# Patient Record
Sex: Female | Born: 1965
Health system: Southern US, Community
[De-identification: ages and names within clinical notes are randomized; demographics above are authoritative.]

## PROBLEM LIST (undated history)

## (undated) DIAGNOSIS — M941 Relapsing polychondritis: Secondary | ICD-10-CM

## (undated) DIAGNOSIS — N6320 Unspecified lump in the left breast, unspecified quadrant: Secondary | ICD-10-CM

## (undated) DIAGNOSIS — K589 Irritable bowel syndrome without diarrhea: Secondary | ICD-10-CM

## (undated) DIAGNOSIS — K219 Gastro-esophageal reflux disease without esophagitis: Secondary | ICD-10-CM

## (undated) DIAGNOSIS — G43909 Migraine, unspecified, not intractable, without status migrainosus: Secondary | ICD-10-CM

## (undated) DIAGNOSIS — G4730 Sleep apnea, unspecified: Secondary | ICD-10-CM

## (undated) DIAGNOSIS — H15009 Unspecified scleritis, unspecified eye: Secondary | ICD-10-CM

## (undated) HISTORY — PX: ESOPHAGEAL DILATION: SHX303

## (undated) HISTORY — PX: BREAST SURGERY: SHX581

## (undated) HISTORY — DX: Relapsing polychondritis: M94.1

## (undated) HISTORY — PX: BREAST EXCISIONAL BIOPSY: SUR124

## (undated) HISTORY — PX: TOOTH EXTRACTION: SUR596

---

## 1998-08-30 ENCOUNTER — Inpatient Hospital Stay (HOSPITAL_COMMUNITY): Admission: AD | Admit: 1998-08-30 | Discharge: 1998-08-30 | Payer: Self-pay | Admitting: Obstetrics and Gynecology

## 1998-10-23 ENCOUNTER — Inpatient Hospital Stay (HOSPITAL_COMMUNITY): Admission: AD | Admit: 1998-10-23 | Discharge: 1998-10-23 | Payer: Self-pay | Admitting: Obstetrics and Gynecology

## 1998-11-10 ENCOUNTER — Ambulatory Visit (HOSPITAL_COMMUNITY): Admission: RE | Admit: 1998-11-10 | Discharge: 1998-11-10 | Payer: Self-pay | Admitting: Obstetrics and Gynecology

## 1998-11-10 ENCOUNTER — Encounter: Payer: Self-pay | Admitting: Obstetrics and Gynecology

## 1998-11-25 ENCOUNTER — Inpatient Hospital Stay (HOSPITAL_COMMUNITY): Admission: AD | Admit: 1998-11-25 | Discharge: 1998-11-27 | Payer: Self-pay | Admitting: *Deleted

## 1999-08-31 ENCOUNTER — Other Ambulatory Visit: Admission: RE | Admit: 1999-08-31 | Discharge: 1999-08-31 | Payer: Self-pay | Admitting: *Deleted

## 2000-04-01 ENCOUNTER — Other Ambulatory Visit: Admission: RE | Admit: 2000-04-01 | Discharge: 2000-04-01 | Payer: Self-pay | Admitting: *Deleted

## 2000-04-17 ENCOUNTER — Encounter: Payer: Self-pay | Admitting: Emergency Medicine

## 2000-04-17 ENCOUNTER — Emergency Department (HOSPITAL_COMMUNITY): Admission: EM | Admit: 2000-04-17 | Discharge: 2000-04-17 | Payer: Self-pay | Admitting: Emergency Medicine

## 2000-05-08 ENCOUNTER — Other Ambulatory Visit: Admission: RE | Admit: 2000-05-08 | Discharge: 2000-05-08 | Payer: Self-pay | Admitting: *Deleted

## 2000-05-22 ENCOUNTER — Other Ambulatory Visit: Admission: RE | Admit: 2000-05-22 | Discharge: 2000-05-22 | Payer: Self-pay | Admitting: *Deleted

## 2000-05-22 ENCOUNTER — Encounter (INDEPENDENT_AMBULATORY_CARE_PROVIDER_SITE_OTHER): Payer: Self-pay

## 2000-06-06 ENCOUNTER — Encounter: Payer: Self-pay | Admitting: Internal Medicine

## 2000-06-06 ENCOUNTER — Encounter: Admission: RE | Admit: 2000-06-06 | Discharge: 2000-06-06 | Payer: Self-pay | Admitting: Internal Medicine

## 2000-11-28 ENCOUNTER — Other Ambulatory Visit: Admission: RE | Admit: 2000-11-28 | Discharge: 2000-11-28 | Payer: Self-pay | Admitting: *Deleted

## 2001-06-03 ENCOUNTER — Other Ambulatory Visit: Admission: RE | Admit: 2001-06-03 | Discharge: 2001-06-03 | Payer: Self-pay | Admitting: *Deleted

## 2001-12-25 ENCOUNTER — Other Ambulatory Visit: Admission: RE | Admit: 2001-12-25 | Discharge: 2001-12-25 | Payer: Self-pay | Admitting: *Deleted

## 2003-01-05 ENCOUNTER — Other Ambulatory Visit: Admission: RE | Admit: 2003-01-05 | Discharge: 2003-01-05 | Payer: Self-pay | Admitting: *Deleted

## 2004-02-08 ENCOUNTER — Other Ambulatory Visit: Admission: RE | Admit: 2004-02-08 | Discharge: 2004-02-08 | Payer: Self-pay | Admitting: *Deleted

## 2004-07-31 ENCOUNTER — Encounter: Admission: RE | Admit: 2004-07-31 | Discharge: 2004-07-31 | Payer: Self-pay | Admitting: Internal Medicine

## 2005-03-03 ENCOUNTER — Ambulatory Visit (HOSPITAL_COMMUNITY): Admission: RE | Admit: 2005-03-03 | Discharge: 2005-03-03 | Payer: Self-pay | Admitting: Diagnostic Radiology

## 2005-08-22 ENCOUNTER — Encounter: Admission: RE | Admit: 2005-08-22 | Discharge: 2005-08-22 | Payer: Self-pay | Admitting: Obstetrics and Gynecology

## 2005-11-29 ENCOUNTER — Encounter: Admission: RE | Admit: 2005-11-29 | Discharge: 2005-11-29 | Payer: Self-pay | Admitting: Internal Medicine

## 2006-09-03 ENCOUNTER — Encounter: Admission: RE | Admit: 2006-09-03 | Discharge: 2006-09-03 | Payer: Self-pay | Admitting: Obstetrics and Gynecology

## 2006-11-28 ENCOUNTER — Ambulatory Visit (HOSPITAL_COMMUNITY): Admission: RE | Admit: 2006-11-28 | Discharge: 2006-11-28 | Payer: Self-pay | Admitting: Gastroenterology

## 2006-12-09 ENCOUNTER — Ambulatory Visit (HOSPITAL_COMMUNITY): Admission: RE | Admit: 2006-12-09 | Discharge: 2006-12-09 | Payer: Self-pay | Admitting: Gastroenterology

## 2006-12-09 ENCOUNTER — Encounter (INDEPENDENT_AMBULATORY_CARE_PROVIDER_SITE_OTHER): Payer: Self-pay | Admitting: Specialist

## 2007-10-03 ENCOUNTER — Encounter: Admission: RE | Admit: 2007-10-03 | Discharge: 2007-10-03 | Payer: Self-pay | Admitting: Obstetrics and Gynecology

## 2008-04-11 ENCOUNTER — Emergency Department (HOSPITAL_COMMUNITY): Admission: EM | Admit: 2008-04-11 | Discharge: 2008-04-11 | Payer: Self-pay | Admitting: Emergency Medicine

## 2008-04-15 ENCOUNTER — Encounter: Admission: RE | Admit: 2008-04-15 | Discharge: 2008-04-15 | Payer: Self-pay | Admitting: Neurology

## 2008-04-15 ENCOUNTER — Ambulatory Visit: Payer: Self-pay

## 2008-04-15 ENCOUNTER — Encounter (INDEPENDENT_AMBULATORY_CARE_PROVIDER_SITE_OTHER): Payer: Self-pay | Admitting: Neurology

## 2008-11-12 ENCOUNTER — Encounter: Admission: RE | Admit: 2008-11-12 | Discharge: 2008-11-12 | Payer: Self-pay | Admitting: Obstetrics and Gynecology

## 2008-11-27 ENCOUNTER — Emergency Department (HOSPITAL_BASED_OUTPATIENT_CLINIC_OR_DEPARTMENT_OTHER): Admission: EM | Admit: 2008-11-27 | Discharge: 2008-11-27 | Payer: Self-pay | Admitting: Emergency Medicine

## 2009-04-12 ENCOUNTER — Encounter: Admission: RE | Admit: 2009-04-12 | Discharge: 2009-04-12 | Payer: Self-pay | Admitting: Obstetrics and Gynecology

## 2009-11-18 ENCOUNTER — Ambulatory Visit (HOSPITAL_COMMUNITY): Admission: RE | Admit: 2009-11-18 | Discharge: 2009-11-18 | Payer: Self-pay | Admitting: General Surgery

## 2009-11-30 ENCOUNTER — Encounter: Admission: RE | Admit: 2009-11-30 | Discharge: 2009-11-30 | Payer: Self-pay | Admitting: Obstetrics and Gynecology

## 2010-11-28 ENCOUNTER — Other Ambulatory Visit: Payer: Self-pay | Admitting: Obstetrics and Gynecology

## 2010-11-28 ENCOUNTER — Encounter
Admission: RE | Admit: 2010-11-28 | Discharge: 2010-11-28 | Payer: Self-pay | Source: Home / Self Care | Attending: Obstetrics and Gynecology | Admitting: Obstetrics and Gynecology

## 2010-11-28 DIAGNOSIS — R921 Mammographic calcification found on diagnostic imaging of breast: Secondary | ICD-10-CM

## 2010-11-29 ENCOUNTER — Other Ambulatory Visit: Payer: Self-pay | Admitting: Radiology

## 2010-11-29 ENCOUNTER — Other Ambulatory Visit: Payer: Self-pay | Admitting: Obstetrics and Gynecology

## 2010-11-29 ENCOUNTER — Ambulatory Visit
Admission: RE | Admit: 2010-11-29 | Discharge: 2010-11-29 | Disposition: A | Discharge status: Home / Self Care | Payer: PRIVATE HEALTH INSURANCE | Source: Ambulatory Visit | Attending: Obstetrics and Gynecology | Admitting: Obstetrics and Gynecology

## 2010-11-29 DIAGNOSIS — R921 Mammographic calcification found on diagnostic imaging of breast: Secondary | ICD-10-CM

## 2011-01-15 LAB — DIFFERENTIAL
Basophils Absolute: 0.1 10*3/uL (ref 0.0–0.1)
Basophils Relative: 1 % (ref 0–1)
Eosinophils Absolute: 0.3 10*3/uL (ref 0.0–0.7)
Eosinophils Relative: 6 % — ABNORMAL HIGH (ref 0–5)
Lymphocytes Relative: 29 % (ref 12–46)
Lymphs Abs: 1.7 10*3/uL (ref 0.7–4.0)
Monocytes Absolute: 0.4 10*3/uL (ref 0.1–1.0)

## 2011-01-15 LAB — URINALYSIS, ROUTINE W REFLEX MICROSCOPIC
Bilirubin Urine: NEGATIVE
Hgb urine dipstick: NEGATIVE
Specific Gravity, Urine: 1.026 (ref 1.005–1.030)
pH: 6 (ref 5.0–8.0)

## 2011-01-15 LAB — URINE MICROSCOPIC-ADD ON

## 2011-01-15 LAB — CBC
RBC: 3.84 MIL/uL — ABNORMAL LOW (ref 3.87–5.11)
WBC: 5.8 10*3/uL (ref 4.0–10.5)

## 2011-02-12 LAB — COMPREHENSIVE METABOLIC PANEL
CO2: 19 mEq/L (ref 19–32)
GFR calc Af Amer: 46 mL/min — ABNORMAL LOW (ref >60)
GFR calc non Af Amer: 38 mL/min — ABNORMAL LOW (ref >60)
Glucose, Bld: 178 mg/dL — ABNORMAL HIGH (ref 70–99)
Potassium: 4.2 mEq/L (ref 3.5–5.1)
Sodium: 142 mEq/L (ref 135–145)
Total Bilirubin: 1.1 mg/dL (ref 0.3–1.2)

## 2011-02-12 LAB — URINALYSIS, ROUTINE W REFLEX MICROSCOPIC
Leukocytes, UA: NEGATIVE
Specific Gravity, Urine: 1.023 (ref 1.005–1.030)
Urobilinogen, UA: 0.2 mg/dL (ref 0.0–1.0)

## 2011-02-12 LAB — URINE MICROSCOPIC-ADD ON

## 2011-02-12 LAB — DIFFERENTIAL
Basophils Relative: 1 % (ref 0–1)
Lymphs Abs: 0.4 10*3/uL — ABNORMAL LOW (ref 0.7–4.0)
Monocytes Relative: 5 % (ref 3–12)
Neutro Abs: 13.2 10*3/uL — ABNORMAL HIGH (ref 1.7–7.7)
Neutrophils Relative %: 91 % — ABNORMAL HIGH (ref 43–77)

## 2011-02-12 LAB — CBC
RDW: 12.1 % (ref 11.5–15.5)
WBC: 14.4 10*3/uL — ABNORMAL HIGH (ref 4.0–10.5)

## 2011-02-12 LAB — URINE CULTURE: Colony Count: >=100000

## 2011-03-16 NOTE — Op Note (Signed)
NAME:  Emily, Fisher             ACCOUNT NO.:  [id number]   MEDICAL RECORD NO.:  [id number]          PATIENT TYPE:  AMB   LOCATION:  ENDO                         FACILITY:  [facility]   PHYSICIAN:  Anselmo Rod, M.D.  DATE OF BIRTH:  [birth year]   DATE OF PROCEDURE:  12/10/2006  DATE OF DISCHARGE:                               OPERATIVE REPORT   PROCEDURE:  Esophagogastroduodenoscopy with multiple cold biopsies.   ENDOSCOPIST:  Charna Kateena, M.D.   INSTRUMENT USED:  Pentax video panendoscope.   INDICATIONS FOR PROCEDURE:  45 year old white female with a history of  dysphagia undergoing EGD and possible dilatation if needed.   PREPROCEDURE PREPARATION:  Informed consent was obtained from the  patient.  The patient was fasted for four hours prior to the procedure.  The risks and benefits of the procedure were discussed with the patient  in great detail.   PREPROCEDURE PHYSICAL:  The patient had stable vital signs.  Neck  supple.  Chest clear to auscultation.  S1 and S2 regular.  Abdomen soft  with normal bowel sounds.   DESCRIPTION OF PROCEDURE:  The patient was placed in the left lateral  decubitus position, sedated with 75 mcg of Fentanyl and 7.5 mg Versed  given intravenously in incremental doses.  Once the patient was  adequately sedated, maintained on low flow oxygen and continuous cardiac  monitoring, the Pentax video panendoscope was advanced through the mouth  piece over the tongue into the esophagus under direct vision.  The  entire esophagus was widely patent except for a slight narrowing at the  USE.  There was some mild difficulty in maneuvering the scope through  the USE and a small amount of  heme was seen on withdrawal of the scope  after the EGD.  A wide open Schatzki's ring was biopsied with jumbo bite  size forceps.  5-6 generous bites of the Schatzki's ring was done.  Some  nodularity was noted at the Z-line and that was biopsied, as well.  A  small hiatal  hernia was seen on high retroflexion.  The rest of the  gastric mucosa appeared healthy and so did the proximal small bowel.  Retroflexion in the high cardia revealed no abnormalities except for the  small hiatal hernia mentioned above.  The patient tolerated the  procedure well without complications.   IMPRESSION:  1. Slight narrowing at the upper esophageal sphincter spontaneously      dilated with passage of scope.  2. Wide open Schatzki's ring with nodular changes at the Z-line,      biopsies done.  3. Small hiatal hernia.  4. Normal proximal small bowel.  5. Normal gastric mucosa.   RECOMMENDATIONS:  1. Increase Prevacid to 30 mg b.i.d.  2. Soft diet for the next day.  3. Avoid all nonsteroidals.  4. Liberal amount of fluid intake with meals.  5. Cut up meats into very small pieces before swallowing.  6. Outpatient follow up as need arises in the future.      Anselmo Rod, M.D.  Electronically Signed     JNM/MEDQ  D:  12/10/2006  T:  12/10/2006  Job:  [id number]

## 2011-07-26 LAB — POCT I-STAT, CHEM 8
BUN: 11
Calcium, Ion: 1.18
Chloride: 105
Creatinine, Ser: 0.7
Glucose, Bld: 96
HCT: 37
Hemoglobin: 12.6
Potassium: 3.7
Sodium: 139
TCO2: 25

## 2011-07-26 LAB — B. BURGDORFI ANTIBODIES: B burgdorferi Ab IgG+IgM: 0.18

## 2011-07-26 LAB — ANGIOTENSIN CONVERTING ENZYME: Angiotensin-Converting Enzyme: 33 U/L (ref 9–67)

## 2011-08-21 ENCOUNTER — Other Ambulatory Visit: Payer: Self-pay | Admitting: Gastroenterology

## 2011-08-21 DIAGNOSIS — R102 Pelvic and perineal pain: Secondary | ICD-10-CM

## 2011-08-27 ENCOUNTER — Ambulatory Visit
Admission: RE | Admit: 2011-08-27 | Discharge: 2011-08-27 | Disposition: A | Discharge status: Home / Self Care | Payer: PRIVATE HEALTH INSURANCE | Source: Ambulatory Visit | Attending: Gastroenterology | Admitting: Gastroenterology

## 2011-08-27 DIAGNOSIS — R102 Pelvic and perineal pain: Secondary | ICD-10-CM

## 2011-10-08 ENCOUNTER — Other Ambulatory Visit: Payer: Self-pay | Admitting: Gastroenterology

## 2011-10-08 DIAGNOSIS — N83209 Unspecified ovarian cyst, unspecified side: Secondary | ICD-10-CM

## 2011-10-09 ENCOUNTER — Ambulatory Visit
Admission: RE | Admit: 2011-10-09 | Discharge: 2011-10-09 | Disposition: A | Discharge status: Home / Self Care | Payer: PRIVATE HEALTH INSURANCE | Source: Ambulatory Visit | Attending: Gastroenterology | Admitting: Gastroenterology

## 2011-10-09 ENCOUNTER — Ambulatory Visit
Admission: RE | Admit: 2011-10-09 | Discharge: 2011-10-09 | Disposition: A | Discharge status: Home / Self Care | Payer: PRIVATE HEALTH INSURANCE | Source: Ambulatory Visit | Attending: Obstetrics and Gynecology | Admitting: Obstetrics and Gynecology

## 2011-10-09 ENCOUNTER — Other Ambulatory Visit: Payer: Self-pay | Admitting: Obstetrics and Gynecology

## 2011-10-09 DIAGNOSIS — N83209 Unspecified ovarian cyst, unspecified side: Secondary | ICD-10-CM

## 2011-10-09 DIAGNOSIS — R102 Pelvic and perineal pain: Secondary | ICD-10-CM

## 2011-10-09 MED ORDER — GADOBENATE DIMEGLUMINE 529 MG/ML IV SOLN
11.0000 mL | Freq: Once | INTRAVENOUS | Status: AC | PRN
  Administered 2011-10-09: 11 mL via INTRAVENOUS

## 2011-12-26 ENCOUNTER — Other Ambulatory Visit: Payer: Self-pay | Admitting: Obstetrics and Gynecology

## 2011-12-26 DIAGNOSIS — R102 Pelvic and perineal pain: Secondary | ICD-10-CM

## 2012-01-02 ENCOUNTER — Ambulatory Visit
Admission: RE | Admit: 2012-01-02 | Discharge: 2012-01-02 | Disposition: A | Discharge status: Home / Self Care | Payer: PRIVATE HEALTH INSURANCE | Source: Ambulatory Visit | Attending: Obstetrics and Gynecology | Admitting: Obstetrics and Gynecology

## 2012-01-02 DIAGNOSIS — R102 Pelvic and perineal pain: Secondary | ICD-10-CM

## 2012-01-02 MED ORDER — GADOBENATE DIMEGLUMINE 529 MG/ML IV SOLN
11.0000 mL | Freq: Once | INTRAVENOUS | Status: AC | PRN
  Administered 2012-01-02: 11 mL via INTRAVENOUS

## 2012-01-30 ENCOUNTER — Ambulatory Visit
Admission: RE | Admit: 2012-01-30 | Discharge: 2012-01-30 | Disposition: A | Discharge status: Home / Self Care | Payer: PRIVATE HEALTH INSURANCE | Source: Ambulatory Visit | Attending: Obstetrics and Gynecology | Admitting: Obstetrics and Gynecology

## 2012-01-30 ENCOUNTER — Other Ambulatory Visit: Payer: Self-pay | Admitting: Obstetrics and Gynecology

## 2012-01-30 DIAGNOSIS — N6310 Unspecified lump in the right breast, unspecified quadrant: Secondary | ICD-10-CM

## 2012-01-30 DIAGNOSIS — Z1231 Encounter for screening mammogram for malignant neoplasm of breast: Secondary | ICD-10-CM

## 2012-01-30 DIAGNOSIS — N644 Mastodynia: Secondary | ICD-10-CM

## 2012-04-12 ENCOUNTER — Encounter (HOSPITAL_COMMUNITY): Payer: Self-pay | Admitting: *Deleted

## 2012-04-12 ENCOUNTER — Other Ambulatory Visit: Payer: Self-pay

## 2012-04-12 ENCOUNTER — Emergency Department (HOSPITAL_COMMUNITY)
Admission: EM | Admit: 2012-04-12 | Discharge: 2012-04-13 | Disposition: A | Discharge status: Home / Self Care | Payer: PRIVATE HEALTH INSURANCE | Attending: Emergency Medicine | Admitting: Emergency Medicine

## 2012-04-12 DIAGNOSIS — R079 Chest pain, unspecified: Secondary | ICD-10-CM | POA: Insufficient documentation

## 2012-04-12 LAB — CBC
HCT: 36 % (ref 36.0–46.0)
MCH: 31.1 pg (ref 26.0–34.0)
MCHC: 35.3 g/dL (ref 30.0–36.0)
MCV: 88 fL (ref 78.0–100.0)
Platelets: 183 10*3/uL (ref 150–400)
RDW: 13.1 % (ref 11.5–15.5)

## 2012-04-12 LAB — BASIC METABOLIC PANEL
BUN: 9 mg/dL (ref 6–23)
Calcium: 9.9 mg/dL (ref 8.4–10.5)
Creatinine, Ser: 0.74 mg/dL (ref 0.50–1.10)
GFR calc Af Amer: >90 mL/min (ref >90)
GFR calc non Af Amer: >90 mL/min (ref >90)

## 2012-04-12 LAB — POCT I-STAT TROPONIN I: Troponin i, poc: 0 ng/mL (ref 0.00–0.08)

## 2012-04-12 NOTE — ED Notes (Signed)
Patient with complain of chest pain/tightness all afternoon.  Pain is located on her left chest and and radiates to lower back.  Patient went for a run this afternoon and afterwards she started having the chest pain.

## 2012-04-13 ENCOUNTER — Emergency Department (HOSPITAL_COMMUNITY): Payer: PRIVATE HEALTH INSURANCE

## 2012-04-13 MED ORDER — FAMOTIDINE 20 MG PO TABS: 20.0000 mg | ORAL_TABLET | Freq: Two times a day (BID) | ORAL | Status: DC

## 2012-04-13 NOTE — ED Provider Notes (Signed)
History     CSN: [id number]  Arrival date & time 04/12/12  2224   First MD Initiated Contact with Patient 04/13/12 0003      Chief Complaint  Patient presents with  . Chest Pain    (Consider location/radiation/quality/duration/timing/severity/associated sxs/prior treatment) HPI Comments: Otherwise healthy 46 year old female who presents with a complaint of left-sided chest pain. She states that she went on a 2 mile run, then went shopping then after she was shopping she developed this pain in her chest which is an achy intermittent pain. It does radiate to her lower back but is not associated with shortness of breath, fever, coughing, swelling in the legs, travel, trauma, immobilization, hormone therapy, history of cancer. She is not a smoker has no hypertension diabetes or hypercholesterolemia and has no family history of heart disease.  Currently she has no chest pain. She states that it is nonexertional positional or associated with deep breathing.  Patient is a 47 y.o. female presenting with chest pain. The history is provided by the patient and the spouse.  Chest Pain     History reviewed. No pertinent past medical history.  History reviewed. No pertinent past surgical history.  No family history on file.  History  Substance Use Topics  . Smoking status: Never Smoker   . Smokeless tobacco: Not on file  . Alcohol Use: Yes    OB History    Grav Para Term Preterm Abortions TAB SAB Ect Mult Living                  Review of Systems  Cardiovascular: Positive for chest pain.  All other systems reviewed and are negative.    Allergies  Review of patient's allergies indicates no known allergies.  Home Medications   Current Outpatient Rx  Name Route Sig Dispense Refill  . VITAMIN C 1000 MG PO TABS Oral Take 1,000 mg by mouth daily.    . ASPIRIN EC 81 MG PO TBEC Oral Take 81 mg by mouth once.    Marland Kitchen VITAMIN D 1000 UNITS PO TABS Oral Take 5,000 Units by mouth daily.     . DEXLANSOPRAZOLE 60 MG PO CPDR Oral Take 60 mg by mouth at bedtime.    Marland Kitchen ESCITALOPRAM OXALATE 20 MG PO TABS Oral Take 20 mg by mouth at bedtime.    Marland Kitchen FEXOFENADINE HCL 60 MG PO TABS Oral Take 60 mg by mouth at bedtime.    Marland Kitchen PROPRANOLOL HCL ER 60 MG PO CP24 Oral Take 60 mg by mouth at bedtime.    . TOPIRAMATE 100 MG PO TABS Oral Take 100 mg by mouth at bedtime.    Marland Kitchen FAMOTIDINE 20 MG PO TABS Oral Take 1 tablet (20 mg total) by mouth 2 (two) times daily. 30 tablet 0    BP 106/68  Pulse 72  Temp 99 F (37.2 C) (Oral)  Resp 16  Ht 5\' 5"  (1.651 m)  Wt 130 lb (58.968 kg)  BMI 21.63 kg/m2  SpO2 100%  LMP 03/23/2012  Physical Exam  Nursing note and vitals reviewed. Constitutional: She appears well-developed and well-nourished. No distress.  HENT:  Head: Normocephalic and atraumatic.  Mouth/Throat: Oropharynx is clear and moist. No oropharyngeal exudate.  Eyes: Conjunctivae and EOM are normal. Pupils are equal, round, and reactive to light. Right eye exhibits no discharge. Left eye exhibits no discharge. No scleral icterus.  Neck: Normal range of motion. Neck supple. No JVD present. No thyromegaly present.  Cardiovascular: Normal rate, regular rhythm, normal  heart sounds and intact distal pulses.  Exam reveals no gallop and no friction rub.   No murmur heard. Pulmonary/Chest: Effort normal and breath sounds normal. No respiratory distress. She has no wheezes. She has no rales. She exhibits no tenderness.  Abdominal: Soft. Bowel sounds are normal. She exhibits no distension and no mass. There is no tenderness.  Musculoskeletal: Normal range of motion. She exhibits no edema and no tenderness.  Lymphadenopathy:    She has no cervical adenopathy.  Neurological: She is alert. Coordination normal.  Skin: Skin is warm and dry. No rash noted. No erythema.  Psychiatric: She has a normal mood and affect. Her behavior is normal.    ED Course  Procedures (including critical care time)   Labs  Reviewed  CBC  BASIC METABOLIC PANEL  POCT I-STAT TROPONIN I   Dg Chest 2 View  04/13/2012  *RADIOLOGY REPORT*  Clinical Data: Intermittent left-sided chest pain, radiating to the back.  CHEST - 2 VIEW  Comparison: Right rib radiograph performed 03/03/2005  Findings: The lungs are well-aerated and clear.  There is no evidence of focal opacification, pleural effusion or pneumothorax.  The heart is normal in size; the mediastinal contour is within normal limits.  No acute osseous abnormalities are seen.  IMPRESSION: No acute cardiopulmonary process seen.  Original Report Authenticated By: Tonia Ghent, M.D.     1. Chest pain       MDM  Lungs are clear, heart sounds are normal, vital signs are normal and EKG is normal. She is currently not having chest pain and her troponin is negative. We'll order a chest x-ray to rule out occult pneumothorax in this otherwise healthy thin young low risk female. On her exam I reassured her that this is unlikely to be an acute coronary syndrome. I have recommended several family Dr. to followup with. She is aware of indications for return to the emergency department.  ED ECG REPORT   Date: 04/13/2012   Rate: 68  Rhythm: normal sinus rhythm  QRS Axis: normal  Intervals: normal  ST/T Wave abnormalities: normal  Conduction Disutrbances:none  Narrative Interpretation:   Old EKG Reviewed: none available    Pt improved, cxr negative, labs negative, very low risk for ACS.  D/c home.    Vida Roller, MD 04/13/12 [phone]

## 2012-04-13 NOTE — ED Notes (Signed)
Patient is AOx4 and comfortable with her discharge instructions. 

## 2012-04-13 NOTE — Discharge Instructions (Signed)
Your caregiver has diagnosed you as having chest pain that is nonspecific for one problem. This means that after looking at you and examining you and ordering tests (such as blood work, chest x-rays and EKG), your caregiver does not believe that the problem is serious enough to need watching in the hospital. This judgment is often made after testing shows no acute heart attack and you are at low risk for sudden acute heart condition. Chest pain comes from many different causes.  Seek immediate medical attention if:   You have severe chest pain, especially if the pain is crushing or pressure-like and spreads to the arms, back, neck, or jaw, or if you have sweating, nausea, shortness of breath. This is an emergency. Don't wait to see if the pain will go away. Get medical help at once. Call 911 immediately. Do not drive herself to the hospital.   Your chest pain gets worse and does not go away with rest.   You have an attack of chest pain lasting longer than usual, despite rest and treatment with the medications your caregiver has prescribed   You awaken from sleep with chest pain or shortness of breath.   You feel faint or dizzy   You have chest pain not typical of your usual pain for which you originally saw your caregiver.  You must have a repeat evaluation within 24 hours for a recheck of your heart.  Please call your doctor this morning to schedule this appointment. If you do not have a family doctor, please see the list of doctors below.  RESOURCE GUIDE  Dental Problems  Patients with Medicaid: [facility]                     [facility] [address]                                           [address] Phone:  [phone]                                                  Phone:  [phone]  If unable to pay or uninsured, contact:  [facility] or [facility] to become qualified for the adult dental clinic.  Chronic Pain  Problems Contact [facility]  [phone] Patients need to be referred by their primary care doctor.  Insufficient Money for Medicine Contact [employer]:  call "211" or [facility] 271-5999.  No Primary Care Doctor Call [facility]  832-8000 Other agencies that provide inexpensive medical care    [facility]  [phone]    [facility]  [phone]    [facility]  [phone]    [facility] Clinic  [phone]    [facility]  [phone]    [facility]  [phone]  Psychological Services [facility]  [phone] [facility]  [phone] [facility]   800 853-5163 (emergency services 641-4993)  Substance Abuse Resources [facility]  336-882-2125 [facility] 336-784-9470 [facility] 336-285-9073 [facility] 336-845-3988 Residential & Outpatient Substance Abuse Program  800-659-3381  Abuse/Neglect [place] Child Abuse Hotline (336) 641-3795 [place] [facility] Abuse Hotline 800-378-5315 (After Hours)  Emergency Shelter [facility] (336) 271-5985  [facility] (336) 275-9566 [facility] (704) 372-4663  MRSA Hotline #:   832-7006    [place] Resources  [facility] [place]     [employer]                          [facility] [address] [place]                       [address]      [address]  [place]                                                [place]                            [place] Phone:  [phone]                                   Phone:  [phone]                 Phone:  [phone]  [facility] Phone:  [phone]  [place] Child Abuse Hotline (336) 342-1394 (336) 342-3537 (After Hours)    

## 2012-09-22 DIAGNOSIS — G43109 Migraine with aura, not intractable, without status migrainosus: Secondary | ICD-10-CM | POA: Insufficient documentation

## 2012-10-15 ENCOUNTER — Other Ambulatory Visit: Payer: Self-pay | Admitting: Obstetrics and Gynecology

## 2013-02-17 ENCOUNTER — Emergency Department
Admission: EM | Admit: 2013-02-17 | Discharge: 2013-02-17 | Disposition: A | Discharge status: Home / Self Care | Payer: PRIVATE HEALTH INSURANCE | Source: Home / Self Care | Attending: Family Medicine | Admitting: Family Medicine

## 2013-02-17 ENCOUNTER — Encounter: Payer: Self-pay | Admitting: *Deleted

## 2013-02-17 DIAGNOSIS — H5712 Ocular pain, left eye: Secondary | ICD-10-CM

## 2013-02-17 DIAGNOSIS — H5710 Ocular pain, unspecified eye: Secondary | ICD-10-CM

## 2013-02-17 HISTORY — DX: Migraine, unspecified, not intractable, without status migrainosus: G43.909

## 2013-02-17 HISTORY — DX: Gastro-esophageal reflux disease without esophagitis: K21.9

## 2013-02-17 MED ORDER — POLYMYXIN B-TRIMETHOPRIM 10000-0.1 UNIT/ML-% OP SOLN: 1.0000 [drp] | OPHTHALMIC | Status: DC

## 2013-02-17 NOTE — ED Notes (Signed)
Pt c/o LT eye redness and pain x 3 days. Denies injury. She reports some drainage this morning. Denies fever. Denies recent URI.

## 2013-02-17 NOTE — ED Provider Notes (Signed)
History     CSN: [id number]  Arrival date & time 02/17/13  1903   First MD Initiated Contact with Patient 02/17/13 1957      Chief Complaint  Patient presents with  . Eye Problem       HPI Comments: Patient states that she rubbed her left eye 3 days ago and subsequently has noted very mild persistent irritation.  The left eye has remained slightly irritated but not painful.  No photophobia.  No foreign body sensation.  This morning she noted a slight amount of clear discharge from the left eye now resolved.  No involvement of right eye.  Patient is a 47 y.o. female presenting with conjunctivitis. The history is provided by the patient.  Conjunctivitis  The current episode started 3 to 5 days ago. The onset was sudden. The problem occurs continuously. The problem has been unchanged. The problem is mild. Nothing relieves the symptoms. Nothing aggravates the symptoms. Associated symptoms include eye discharge, eye pain and eye redness. Pertinent negatives include no fever, no decreased vision, no double vision, no eye itching, no photophobia, no congestion, no ear discharge, no ear pain, no headaches, no rhinorrhea, no sore throat, no swollen glands and no URI. The eye pain is mild. The left eye is affected.The eye pain is not associated with movement. The eyelid exhibits redness.    Past Medical History  Diagnosis Date  . GERD (gastroesophageal reflux disease)   . Migraine     Past Surgical History  Procedure Laterality Date  . Esophageal dilation      History reviewed. No pertinent family history.  History  Substance Use Topics  . Smoking status: Never Smoker   . Smokeless tobacco: Not on file  . Alcohol Use: Yes    OB History   Grav Para Term Preterm Abortions TAB SAB Ect Mult Living                  Review of Systems  Constitutional: Negative for fever.  HENT: Negative for ear pain, congestion, sore throat, rhinorrhea and ear discharge.   Eyes: Positive for pain,  discharge and redness. Negative for double vision, photophobia and itching.  Neurological: Negative for headaches.  All other systems reviewed and are negative.    Allergies  Gluten meal  Home Medications   Current Outpatient Rx  Name  Route  Sig  Dispense  Refill  . celecoxib (CELEBREX) 200 MG capsule   Oral   Take 200 mg by mouth 2 (two) times daily.         Marland Kitchen eletriptan (RELPAX) 20 MG tablet   Oral   One tablet by mouth at onset of headache. May repeat in 2 hours if headache persists or recurs. may repeat in 2 hours if necessary         . Ascorbic Acid (VITAMIN C) 1000 MG tablet   Oral   Take 1,000 mg by mouth daily.         Marland Kitchen aspirin EC 81 MG tablet   Oral   Take 81 mg by mouth once.         . cholecalciferol (VITAMIN D) 1000 UNITS tablet   Oral   Take 5,000 Units by mouth daily.         Marland Kitchen dexlansoprazole (DEXILANT) 60 MG capsule   Oral   Take 60 mg by mouth at bedtime.         Marland Kitchen escitalopram (LEXAPRO) 20 MG tablet   Oral   Take  20 mg by mouth at bedtime.         . famotidine (PEPCID) 20 MG tablet   Oral   Take 1 tablet (20 mg total) by mouth 2 (two) times daily.   30 tablet   0   . fexofenadine (ALLEGRA) 60 MG tablet   Oral   Take 60 mg by mouth at bedtime.         . propranolol ER (INDERAL LA) 60 MG 24 hr capsule   Oral   Take 60 mg by mouth at bedtime.         . topiramate (TOPAMAX) 100 MG tablet   Oral   Take 100 mg by mouth at bedtime.         Marland Kitchen trimethoprim-polymyxin b (POLYTRIM) ophthalmic solution   Left Eye   Place 1 drop into the left eye every 4 (four) hours.   10 mL   0     BP 120/76  Pulse 57  Temp(Src) 97.9 F (36.6 C) (Oral)  Resp 16  Ht 5\' 5"  (1.651 m)  Wt 135 lb (61.236 kg)  BMI 22.47 kg/m2  SpO2 100%  LMP 01/27/2013  Physical Exam Nursing notes and Vital Signs reviewed. Appearance:  Patient appears healthy, stated age, and in no acute distress Eyes:  Pupils are equal, round, and reactive to  light and accomodation.  Extraocular movement is intact.  Right conjunctivae normal.  Left conjunctivae slightly injected.  No discharge.  No lid swelling or tenderness.  No foreign body with lid eversion.  Fluorescein left eye negative for abrasion.  Fundi benign.  No photophobia. Ears:  Canals normal.  Tympanic membranes normal.  Nose:  Normal Pharynx:  Normal Neck:  Supple.  No adenopathy Skin:  No rash present.    ED Course  Procedures  none      1. Eye pain, left ?etiology       MDM  Begin Polytrim ophth solution.  Recommend followup with ophthalmologist or optometrist within 24 to 48 hours for further exam and measure IOP.        Lattie Haw, MD 02/21/13 [phone]

## 2013-02-21 ENCOUNTER — Telehealth: Payer: Self-pay | Admitting: Emergency Medicine

## 2013-02-26 DIAGNOSIS — H15009 Unspecified scleritis, unspecified eye: Secondary | ICD-10-CM | POA: Insufficient documentation

## 2013-03-11 DIAGNOSIS — H02409 Unspecified ptosis of unspecified eyelid: Secondary | ICD-10-CM | POA: Insufficient documentation

## 2013-03-16 ENCOUNTER — Ambulatory Visit
Admission: RE | Admit: 2013-03-16 | Discharge: 2013-03-16 | Disposition: A | Discharge status: Home / Self Care | Payer: PRIVATE HEALTH INSURANCE | Source: Ambulatory Visit

## 2013-03-16 ENCOUNTER — Other Ambulatory Visit: Payer: Self-pay

## 2013-03-16 ENCOUNTER — Other Ambulatory Visit: Payer: Self-pay | Admitting: Obstetrics and Gynecology

## 2013-03-16 DIAGNOSIS — Z1231 Encounter for screening mammogram for malignant neoplasm of breast: Secondary | ICD-10-CM

## 2013-04-04 ENCOUNTER — Encounter (HOSPITAL_BASED_OUTPATIENT_CLINIC_OR_DEPARTMENT_OTHER): Payer: Self-pay | Admitting: *Deleted

## 2013-04-04 ENCOUNTER — Emergency Department (HOSPITAL_BASED_OUTPATIENT_CLINIC_OR_DEPARTMENT_OTHER)
Admission: EM | Admit: 2013-04-04 | Discharge: 2013-04-04 | Disposition: A | Discharge status: Home / Self Care | Payer: PRIVATE HEALTH INSURANCE | Attending: Emergency Medicine | Admitting: Emergency Medicine

## 2013-04-04 DIAGNOSIS — G43909 Migraine, unspecified, not intractable, without status migrainosus: Secondary | ICD-10-CM | POA: Insufficient documentation

## 2013-04-04 DIAGNOSIS — K219 Gastro-esophageal reflux disease without esophagitis: Secondary | ICD-10-CM | POA: Insufficient documentation

## 2013-04-04 DIAGNOSIS — Z79899 Other long term (current) drug therapy: Secondary | ICD-10-CM | POA: Insufficient documentation

## 2013-04-04 DIAGNOSIS — H9209 Otalgia, unspecified ear: Secondary | ICD-10-CM | POA: Insufficient documentation

## 2013-04-04 DIAGNOSIS — IMO0002 Reserved for concepts with insufficient information to code with codable children: Secondary | ICD-10-CM | POA: Insufficient documentation

## 2013-04-04 DIAGNOSIS — L089 Local infection of the skin and subcutaneous tissue, unspecified: Secondary | ICD-10-CM | POA: Insufficient documentation

## 2013-04-04 DIAGNOSIS — Z8669 Personal history of other diseases of the nervous system and sense organs: Secondary | ICD-10-CM | POA: Insufficient documentation

## 2013-04-04 HISTORY — DX: Unspecified scleritis, unspecified eye: H15.009

## 2013-04-04 MED ORDER — SULFAMETHOXAZOLE-TRIMETHOPRIM 800-160 MG PO TABS: 1.0000 | ORAL_TABLET | Freq: Two times a day (BID) | ORAL | Status: AC

## 2013-04-04 NOTE — ED Provider Notes (Signed)
  Medical screening examination/treatment/procedure(s) were performed by non-physician practitioner and as supervising physician I was immediately available for consultation/collaboration.    Zlatan Hornback, MD 04/04/13 2316 

## 2013-04-04 NOTE — ED Notes (Signed)
MD at bedside. Langston Masker, PA in to assess.

## 2013-04-04 NOTE — ED Notes (Signed)
Pt states she was seen last Sat at [facility] for a 5 day hx of left ear redness and tenderness. Has been on Prednisone (for steritis) and Keflex but is getting worse. Appt with ENT on MOnday.

## 2013-04-04 NOTE — ED Provider Notes (Signed)
History     CSN: [id number]  Arrival date & time 04/04/13  1940   First MD Initiated Contact with Patient 04/04/13 2012      Chief Complaint  Patient presents with  . Otalgia    (Consider location/radiation/quality/duration/timing/severity/associated sxs/prior treatment) Patient is a 47 y.o. female presenting with ear pain. The history is provided by the patient. No language interpreter was used.  Otalgia Location:  Left Quality:  Aching Severity:  Moderate Onset quality:  Gradual Duration:  5 days Timing:  Constant Progression:  Worsening Chronicity:  New Relieved by:  Nothing Worsened by:  Nothing tried Associated symptoms: no ear discharge and no fever   Risk factors: no prior ear surgery   Pt is currently on keflex for a skin infection to left ear.  Pt reports increasing pain and increasing redness  Past Medical History  Diagnosis Date  . GERD (gastroesophageal reflux disease)   . Migraine   . Scleritis     Past Surgical History  Procedure Laterality Date  . Esophageal dilation      History reviewed. No pertinent family history.  History  Substance Use Topics  . Smoking status: Never Smoker   . Smokeless tobacco: Not on file  . Alcohol Use: Yes    OB History   Grav Para Term Preterm Abortions TAB SAB Ect Mult Living                  Review of Systems  Constitutional: Negative for fever.  HENT: Positive for ear pain. Negative for ear discharge.   All other systems reviewed and are negative.    Allergies  Gluten meal  Home Medications   Current Outpatient Rx  Name  Route  Sig  Dispense  Refill  . cephALEXin (KEFLEX) 500 MG capsule   Oral   Take 500 mg by mouth 3 (three) times daily.         . prednisoLONE 5 MG TABS   Oral   Take 15 mg by mouth.         . Ascorbic Acid (VITAMIN C) 1000 MG tablet   Oral   Take 1,000 mg by mouth daily.         Marland Kitchen aspirin EC 81 MG tablet   Oral   Take 81 mg by mouth once.         . celecoxib  (CELEBREX) 200 MG capsule   Oral   Take 200 mg by mouth 2 (two) times daily.         . cholecalciferol (VITAMIN D) 1000 UNITS tablet   Oral   Take 5,000 Units by mouth daily.         Marland Kitchen dexlansoprazole (DEXILANT) 60 MG capsule   Oral   Take 60 mg by mouth at bedtime.         Marland Kitchen eletriptan (RELPAX) 20 MG tablet   Oral   One tablet by mouth at onset of headache. May repeat in 2 hours if headache persists or recurs. may repeat in 2 hours if necessary         . escitalopram (LEXAPRO) 20 MG tablet   Oral   Take 20 mg by mouth at bedtime.         . famotidine (PEPCID) 20 MG tablet   Oral   Take 1 tablet (20 mg total) by mouth 2 (two) times daily.   30 tablet   0   . fexofenadine (ALLEGRA) 60 MG tablet   Oral   Take  60 mg by mouth at bedtime.         . propranolol ER (INDERAL LA) 60 MG 24 hr capsule   Oral   Take 60 mg by mouth at bedtime.         . sulfamethoxazole-trimethoprim (BACTRIM DS,SEPTRA DS) 800-160 MG per tablet   Oral   Take 1 tablet by mouth 2 (two) times daily.   20 tablet   0   . topiramate (TOPAMAX) 100 MG tablet   Oral   Take 100 mg by mouth at bedtime.         Marland Kitchen trimethoprim-polymyxin b (POLYTRIM) ophthalmic solution   Left Eye   Place 1 drop into the left eye every 4 (four) hours.   10 mL   0     BP 133/89  Pulse 62  Temp(Src) 98.3 F (36.8 C) (Oral)  Resp 20  Ht 5\' 5"  (1.651 m)  Wt 130 lb (58.968 kg)  BMI 21.63 kg/m2  SpO2 99%  LMP 03/21/2013  Physical Exam  Nursing note and vitals reviewed. Constitutional: She appears well-developed and well-nourished.  HENT:  Head: Normocephalic.  Right Ear: External ear normal.  Left ear swollen red, tender,  Warm to touch  Neurological: She is alert.  Skin: Skin is warm.    ED Course  Procedures (including critical care time)  Labs Reviewed - No data to display No results found.  Pt is on prednisone for scleritis.  She has an appointment with Dr. Jearld Fenton for recheck on  Monday 1. Skin infection    Dr. Jeraldine Loots in to see and examine pt   MDM          Elson Areas, PA-C 04/04/13 2315

## 2013-04-04 NOTE — Discharge Instructions (Signed)
Wound Infection  A wound infection happens when a type of germ (bacteria) starts growing in the wound. In some cases, this can cause the wound to break open. If cared for properly, the infected wound will heal from the inside to the outside. Wound infections need treatment.  CAUSES  An infection is caused by bacteria growing in the wound.   SYMPTOMS    Increase in redness, swelling, or pain at the wound site.   Increase in drainage at the wound site.   Wound or bandage (dressing) starts to smell bad.   Fever.   Feeling tired or fatigued.   Pus draining from the wound.  TREATMENT   You caregiver will prescribe antibiotic medicine. The wound infection should improve within 24 to 48 hours. Any redness around the wound should stop spreading and the wound should be less painful.   HOME CARE INSTRUCTIONS    Only take over-the-counter or prescription medicines for pain, discomfort, or fever as directed by your caregiver.   Take your antibiotics as directed. Finish them even if you start to feel better.   Gently wash the area with mild soap and water 2 times a day, or as directed. Rinse off the soap. Pat the area dry with a clean towel. Do not rub the wound. This may cause bleeding.   Follow your caregiver's instructions for how often you need to change the dressing.   Apply ointment and a dressing to the wound as directed.   If the dressing sticks, moisten it with soapy water and gently remove it.   Change the bandage right away if it becomes wet, dirty, or develops a bad smell.   Take showers. Do not take tub baths, swim, or do anything that may soak the wound until it is healed.   Avoid exercises that make you sweat heavily.   Use anti-itch medicine as directed by your caregiver. The wound may itch when it is healing. Do not pick or scratch at the wound.   Follow up with your caregiver to get your wound rechecked as directed.  SEEK MEDICAL CARE IF:   You have an increase in swelling, pain, or redness  around the wound.   You have an increase in the amount of pus coming from the wound.   There is a bad smell coming from the wound.   More of the wound breaks open.   You have a fever.  MAKE SURE YOU:    Understand these instructions.   Will watch your condition.   Will get help right away if you are not doing well or get worse.  Document Released: 07/14/2003 Document Revised: 01/07/2012 Document Reviewed: 02/18/2011  [employer] Patient Information 2014 [employer]

## 2013-04-11 ENCOUNTER — Emergency Department (HOSPITAL_BASED_OUTPATIENT_CLINIC_OR_DEPARTMENT_OTHER)
Admission: EM | Admit: 2013-04-11 | Discharge: 2013-04-11 | Disposition: A | Discharge status: Home / Self Care | Payer: PRIVATE HEALTH INSURANCE | Attending: Emergency Medicine | Admitting: Emergency Medicine

## 2013-04-11 ENCOUNTER — Encounter (HOSPITAL_BASED_OUTPATIENT_CLINIC_OR_DEPARTMENT_OTHER): Payer: Self-pay | Admitting: *Deleted

## 2013-04-11 DIAGNOSIS — Z7982 Long term (current) use of aspirin: Secondary | ICD-10-CM | POA: Insufficient documentation

## 2013-04-11 DIAGNOSIS — T380X5A Adverse effect of glucocorticoids and synthetic analogues, initial encounter: Secondary | ICD-10-CM | POA: Insufficient documentation

## 2013-04-11 DIAGNOSIS — T380X1A Poisoning by glucocorticoids and synthetic analogues, accidental (unintentional), initial encounter: Secondary | ICD-10-CM | POA: Insufficient documentation

## 2013-04-11 DIAGNOSIS — G43909 Migraine, unspecified, not intractable, without status migrainosus: Secondary | ICD-10-CM | POA: Insufficient documentation

## 2013-04-11 DIAGNOSIS — IMO0002 Reserved for concepts with insufficient information to code with codable children: Secondary | ICD-10-CM | POA: Insufficient documentation

## 2013-04-11 DIAGNOSIS — Z79899 Other long term (current) drug therapy: Secondary | ICD-10-CM | POA: Insufficient documentation

## 2013-04-11 DIAGNOSIS — Z8669 Personal history of other diseases of the nervous system and sense organs: Secondary | ICD-10-CM | POA: Insufficient documentation

## 2013-04-11 DIAGNOSIS — K219 Gastro-esophageal reflux disease without esophagitis: Secondary | ICD-10-CM | POA: Insufficient documentation

## 2013-04-11 LAB — CBC WITH DIFFERENTIAL/PLATELET
Basophils Relative: 0 % (ref 0–1)
Eosinophils Absolute: 0 10*3/uL (ref 0.0–0.7)
Eosinophils Relative: 0 % (ref 0–5)
Lymphs Abs: 0.7 10*3/uL (ref 0.7–4.0)
MCH: 32.3 pg (ref 26.0–34.0)
MCHC: 34.7 g/dL (ref 30.0–36.0)
MCV: 93.2 fL (ref 78.0–100.0)
Platelets: 258 10*3/uL (ref 150–400)
RBC: 3.99 MIL/uL (ref 3.87–5.11)
RDW: 12.7 % (ref 11.5–15.5)

## 2013-04-11 LAB — COMPREHENSIVE METABOLIC PANEL
ALT: 12 U/L (ref 0–35)
Albumin: 3.8 g/dL (ref 3.5–5.2)
Calcium: 9.8 mg/dL (ref 8.4–10.5)
GFR calc Af Amer: >90 mL/min (ref >90)
Glucose, Bld: 112 mg/dL — ABNORMAL HIGH (ref 70–99)
Sodium: 143 mEq/L (ref 135–145)
Total Protein: 6.6 g/dL (ref 6.0–8.3)

## 2013-04-11 NOTE — ED Provider Notes (Signed)
History     This chart was scribed for Charles B. Bernette Mayers, MD by Jiles Prows, ED Scribe. The patient was seen in room MH06/MH06 and the patient's care was started at 4:25 PM.    CSN: [id number]  Arrival date & time 04/11/13  1545   Chief Complaint  Patient presents with  . Medication Reaction    The history is provided by the patient and medical records. No language interpreter was used.   HPI Comments: Emily Fisher is a 47 y.o. female with a h/o scleritis and GERD who presents to the Emergency Department complaining of constant, mild to moderate puffiness in face, hands, and legs she first noticed.  Pt reports she began taking IV Clindamycin on Friday for infection in pinna of L ear.  Pt states she is using 600 mg every 8 hours at home via peripheral.  She claims she is eating and drinking well and has no trouble urinating.  Pt reports that when she woke up this morning she noticed her face was puffy and her hands and legs were puffy as well.  She reports that she has gained 3 pounds in 24 hours.  Pt denies jaundice, belly pain,  headache, diaphoresis, fever, chills, nausea, vomiting, diarrhea, weakness, cough, SOB and any other pain.   Pt states she is being treated with prednisone for scleritis currently on 15mg  daily, she was on 20mg  daily for a month prior.   Past Medical History  Diagnosis Date  . GERD (gastroesophageal reflux disease)   . Migraine   . Scleritis     Past Surgical History  Procedure Laterality Date  . Esophageal dilation      History reviewed. No pertinent family history.  History  Substance Use Topics  . Smoking status: Never Smoker   . Smokeless tobacco: Not on file  . Alcohol Use: Yes    OB History   Grav Para Term Preterm Abortions TAB SAB Ect Mult Living                  Review of Systems  Constitutional: Negative for fever and chills.  HENT: Negative for sore throat and mouth sores.   Respiratory: Negative for cough and shortness of  breath.   Gastrointestinal: Negative for nausea, vomiting and diarrhea.  Skin: Negative for rash and wound.  Neurological: Negative for seizures and syncope.   A complete 10 system review of systems was obtained and all systems are negative except as noted in the HPI and PMH.   Allergies  Gluten meal  Home Medications   Current Outpatient Rx  Name  Route  Sig  Dispense  Refill  . Ascorbic Acid (VITAMIN C) 1000 MG tablet   Oral   Take 1,000 mg by mouth daily.         Marland Kitchen aspirin EC 81 MG tablet   Oral   Take 81 mg by mouth once.         . celecoxib (CELEBREX) 200 MG capsule   Oral   Take 200 mg by mouth 2 (two) times daily.         . cephALEXin (KEFLEX) 500 MG capsule   Oral   Take 500 mg by mouth 3 (three) times daily.         . cholecalciferol (VITAMIN D) 1000 UNITS tablet   Oral   Take 5,000 Units by mouth daily.         Marland Kitchen dexlansoprazole (DEXILANT) 60 MG capsule   Oral  Take 60 mg by mouth at bedtime.         Marland Kitchen eletriptan (RELPAX) 20 MG tablet   Oral   One tablet by mouth at onset of headache. May repeat in 2 hours if headache persists or recurs. may repeat in 2 hours if necessary         . escitalopram (LEXAPRO) 20 MG tablet   Oral   Take 20 mg by mouth at bedtime.         . famotidine (PEPCID) 20 MG tablet   Oral   Take 1 tablet (20 mg total) by mouth 2 (two) times daily.   30 tablet   0   . fexofenadine (ALLEGRA) 60 MG tablet   Oral   Take 60 mg by mouth at bedtime.         . prednisoLONE 5 MG TABS   Oral   Take 15 mg by mouth.         . propranolol ER (INDERAL LA) 60 MG 24 hr capsule   Oral   Take 60 mg by mouth at bedtime.         . sulfamethoxazole-trimethoprim (BACTRIM DS,SEPTRA DS) 800-160 MG per tablet   Oral   Take 1 tablet by mouth 2 (two) times daily.   20 tablet   0   . topiramate (TOPAMAX) 100 MG tablet   Oral   Take 100 mg by mouth at bedtime.         Marland Kitchen trimethoprim-polymyxin b (POLYTRIM) ophthalmic  solution   Left Eye   Place 1 drop into the left eye every 4 (four) hours.   10 mL   0     BP 115/83  Pulse 70  Temp(Src) 98.6 F (37 C) (Oral)  Resp 20  Ht 5\' 5"  (1.651 m)  Wt 130 lb (58.968 kg)  BMI 21.63 kg/m2  SpO2 98%  LMP 03/21/2013  Physical Exam  Nursing note and vitals reviewed. Constitutional: She is oriented to person, place, and time. She appears well-developed and well-nourished.  HENT:  Head: Normocephalic and atraumatic.  Left ear is red.   Eyes: EOM are normal. Pupils are equal, round, and reactive to light.  Neck: Normal range of motion. Neck supple.  Cardiovascular: Normal rate, normal heart sounds and intact distal pulses.   Pulmonary/Chest: Effort normal and breath sounds normal.  Abdominal: Bowel sounds are normal. She exhibits no distension. There is no tenderness.  Musculoskeletal: Normal range of motion. She exhibits edema (Mild pitting edema to lower extremities bilaterally.  equal.). She exhibits no tenderness.  Neurological: She is alert and oriented to person, place, and time. She has normal strength. No cranial nerve deficit or sensory deficit.  Skin: Skin is warm and dry. No rash noted.  Psychiatric: She has a normal mood and affect.    ED Course  Procedures (including critical care time) DIAGNOSTIC STUDIES: Oxygen Saturation is 98% on RA, normal by my interpretation.    COORDINATION OF CARE: 4:31 PM - Discussed ED treatment with pt at bedside including checking for kidney or liver issues and pt agrees.   Labs Reviewed  CBC WITH DIFFERENTIAL - Abnormal; Notable for the following:    Neutrophils Relative % 87 (*)    Lymphocytes Relative 10 (*)    Monocytes Relative 2 (*)    All other components within normal limits  COMPREHENSIVE METABOLIC PANEL - Abnormal; Notable for the following:    Glucose, Bld 112 (*)    Total Bilirubin 0.2 (*)  GFR calc non Af Amer 86 (*)    All other components within normal limits   No results  found.  1. Prednisone adverse reaction, initial encounter     MDM  Labs unremarkable. No signs of liver or kidney injury. Doubt this is allergic reaction. Suspect this is due to long term steroid use and advised patient to continue both prednisone and clindamycin. She was advised to return for any other concerns.      I personally performed the services described in this documentation, which was scribed in my presence. The recorded information has been reviewed and is accurate.     Charles B. Bernette Mayers, MD 04/11/13 [phone]

## 2013-04-11 NOTE — ED Notes (Signed)
Pt states she has been on IV Clindamycin for an infection on her ear. She has had 4 doses. After the 3rd dose, began to notice face and hands "puffy". +3 lb wt gain in 24 hrs. No resp distress or rash.

## 2013-04-11 NOTE — ED Notes (Signed)
MD at bedside. 

## 2013-04-21 DIAGNOSIS — M941 Relapsing polychondritis: Secondary | ICD-10-CM | POA: Insufficient documentation

## 2013-04-27 ENCOUNTER — Ambulatory Visit: Payer: PRIVATE HEALTH INSURANCE | Admitting: Internal Medicine

## 2013-06-02 ENCOUNTER — Other Ambulatory Visit: Payer: Self-pay | Admitting: Obstetrics and Gynecology

## 2013-08-12 ENCOUNTER — Ambulatory Visit
Admission: RE | Admit: 2013-08-12 | Discharge: 2013-08-12 | Disposition: A | Discharge status: Home / Self Care | Payer: PRIVATE HEALTH INSURANCE | Source: Ambulatory Visit | Attending: Internal Medicine | Admitting: Internal Medicine

## 2013-08-12 ENCOUNTER — Other Ambulatory Visit: Payer: Self-pay | Admitting: Internal Medicine

## 2013-08-12 DIAGNOSIS — IMO0002 Reserved for concepts with insufficient information to code with codable children: Secondary | ICD-10-CM

## 2014-01-17 DIAGNOSIS — D849 Immunodeficiency, unspecified: Secondary | ICD-10-CM | POA: Insufficient documentation

## 2014-06-02 ENCOUNTER — Ambulatory Visit
Admission: RE | Admit: 2014-06-02 | Discharge: 2014-06-02 | Disposition: A | Discharge status: Home / Self Care | Payer: PRIVATE HEALTH INSURANCE | Source: Ambulatory Visit

## 2014-06-02 ENCOUNTER — Other Ambulatory Visit: Payer: Self-pay

## 2014-06-02 DIAGNOSIS — Z1231 Encounter for screening mammogram for malignant neoplasm of breast: Secondary | ICD-10-CM

## 2014-06-21 DIAGNOSIS — Z79899 Other long term (current) drug therapy: Secondary | ICD-10-CM | POA: Insufficient documentation

## 2014-06-23 ENCOUNTER — Other Ambulatory Visit: Payer: Self-pay | Admitting: Obstetrics and Gynecology

## 2014-06-25 LAB — CYTOLOGY - PAP

## 2014-08-12 ENCOUNTER — Encounter (INDEPENDENT_AMBULATORY_CARE_PROVIDER_SITE_OTHER): Payer: Self-pay

## 2014-08-12 ENCOUNTER — Encounter: Payer: Self-pay | Admitting: Internal Medicine

## 2014-08-12 ENCOUNTER — Ambulatory Visit (INDEPENDENT_AMBULATORY_CARE_PROVIDER_SITE_OTHER): Payer: PRIVATE HEALTH INSURANCE | Admitting: Internal Medicine

## 2014-08-12 VITALS — BP 90/60 | HR 69 | Ht 65.0 in | Wt 143.4 lb

## 2014-08-12 DIAGNOSIS — G4739 Other sleep apnea: Secondary | ICD-10-CM | POA: Insufficient documentation

## 2014-08-12 DIAGNOSIS — G4733 Obstructive sleep apnea (adult) (pediatric): Secondary | ICD-10-CM

## 2014-08-12 NOTE — Patient Instructions (Signed)
Order-  Schedule unattended home sleep study      Dx OSA

## 2014-08-12 NOTE — Progress Notes (Signed)
08/12/14- [age] yoF never smoker Radiologist referred courtesy of Dr Vincente PoliGrewal for sleep medicine evaluation On meds for polychondritis. Husband has been telling her she snores, but hasn't described apnea. Worse if stuffy nose. Usually feels rested. Caffeine 2 cups/ day. No ENT surgery. Nl thyroid.  No recognized limb movement or other disturbance. Sleep schedule - usual bedtime 9-11PM, latency 2 minutes, waking 1-2x before up 5:30 AM. Two weeks/ year rotate shift to nights.  Father on CPAP.  Prior to Admission medications   Medication Sig Start Date End Date Taking? Authorizing Provider  Ascorbic Acid (VITAMIN C) 1000 MG tablet Take 1,000 mg by mouth daily.   Yes Historical Provider, MD  cephALEXin (KEFLEX) 500 MG capsule Take 500 mg by mouth.   Yes Historical Provider, MD  cholecalciferol (VITAMIN D) 1000 UNITS tablet Take 5,000 Units by mouth daily.   Yes Historical Provider, MD  dexlansoprazole (DEXILANT) 60 MG capsule Take 60 mg by mouth at bedtime.   Yes Historical Provider, MD  eletriptan (RELPAX) 20 MG tablet One tablet by mouth at onset of headache. May repeat in 2 hours if headache persists or recurs. may repeat in 2 hours if necessary   Yes Historical Provider, MD  escitalopram (LEXAPRO) 20 MG tablet Take 20 mg by mouth at bedtime.   Yes Historical Provider, MD  fexofenadine (ALLEGRA) 60 MG tablet Take 60 mg by mouth at bedtime.   Yes Historical Provider, MD  Multiple Vitamin (MULTIVITAMIN) tablet Take 1 tablet by mouth daily.   Yes Historical Provider, MD  mycophenolate (MYFORTIC) 360 MG TBEC EC tablet Take 360 mg by mouth 2 (two) times daily. 2 tablets twice a day   Yes Historical Provider, MD  prednisoLONE 5 MG TABS Take 15 mg by mouth daily.    Yes Historical Provider, MD  propranolol ER (INDERAL LA) 60 MG 24 hr capsule Take 60 mg by mouth at bedtime.   Yes Historical Provider, MD  topiramate (TOPAMAX) 100 MG tablet Take 100 mg by mouth at bedtime.   Yes Historical Provider, MD   Past  Medical History  Diagnosis Date  . GERD (gastroesophageal reflux disease)   . Migraine   . Scleritis   . Scleritis   . Relapsing polychondritis    Past Surgical History  Procedure Laterality Date  . Esophageal dilation    . Breast surgery    . Tooth extraction     Family History  Problem Relation Age of Onset  . Cancer Paternal Grandmother     Renal Cell  . Cancer Maternal Grandmother     Breast  . Hypertension Father    History   Social History  . Marital Status: Married    Spouse Name: N/A    Number of Children: N/A  . Years of Education: N/A   Occupational History  . Not on file.   Social History Main Topics  . Smoking status: Never Smoker   . Smokeless tobacco: Not on file  . Alcohol Use: Yes  . Drug Use: No  . Sexual Activity: Yes    Birth Control/ Protection: Surgical   Other Topics Concern  . Not on file   Social History Narrative  . No narrative on file   ROS-see HPI Constitutional:   No-   weight loss, night sweats, fevers, chills, fatigue, lassitude. HEENT:   No-  headaches, difficulty swallowing, tooth/dental problems, sore throat,       No-  sneezing, itching, ear ache, nasal congestion, post nasal drip,  CV:  No-   chest pain, orthopnea, PND, swelling in lower extremities, anasarca,                                                    dizziness, palpitations Resp: No-   shortness of breath with exertion or at rest.              No-   productive cough,  No non-productive cough,  No- coughing up of blood.              No-   change in color of mucus.  No- wheezing.   Skin: No-   rash or lesions. GI:  No-   heartburn, indigestion, abdominal pain, nausea, vomiting, diarrhea,                 change in bowel habits, loss of appetite GU: No-   dysuria, change in color of urine, no urgency or frequency.  No- flank pain. MS:  No-   joint pain or swelling.  No- decreased range of motion.  No- back pain. Neuro-     nothing unusual Psych:  No- change in mood  or affect. No depression or anxiety.  No memory loss.  OBJ- Physical Exam General- Alert, Oriented, Affect-appropriate, Distress- none acute, medium build Skin- rash-none, lesions- none, excoriation- none Lymphadenopathy- none Head- atraumatic            Eyes- Gross vision intact, PERRLA, conjunctivae and secretions clear            Ears- Hearing, canals-normal            Nose- Clear, no-Septal dev, mucus, polyps, erosion, perforation             Throat- Mallampati III , mucosa clear , drainage- none, tonsils- atrophic Neck- flexible , trachea midline, no stridor , thyroid nl, carotid no bruit Chest - symmetrical excursion , unlabored           Heart/CV- RRR , no murmur , no gallop  , no rub, nl s1 s2                           - JVD- none , edema- none, stasis changes- none, varices- none           Lung- clear to P&A, wheeze- none, cough- none , dullness-none, rub- none           Chest wall-  Abd- tender-no, distended-no, bowel sounds-present, HSM- no Br/ Gen/ Rectal- Not done, not indicated Extrem- cyanosis- none, clubbing, none, atrophy- none, strength- nl Neuro- grossly intact to observation

## 2014-09-28 DIAGNOSIS — G4733 Obstructive sleep apnea (adult) (pediatric): Secondary | ICD-10-CM

## 2014-10-12 ENCOUNTER — Encounter: Payer: Self-pay | Admitting: Internal Medicine

## 2014-10-12 DIAGNOSIS — G4733 Obstructive sleep apnea (adult) (pediatric): Secondary | ICD-10-CM

## 2014-10-25 ENCOUNTER — Encounter: Payer: Self-pay | Admitting: Internal Medicine

## 2014-10-26 NOTE — Telephone Encounter (Signed)
Pt is asking for results of sleep study. Please advise. Carron CurieJennifer Kaida Games, CMA

## 2014-10-27 NOTE — Telephone Encounter (Signed)
I see there has been communication with Ms Manson PasseyBrown. It would be okay to send her copy of the unattended home sleep study. I will serve her better if i can sit down and go over it with her, ask a few clarifying questions, and discuss options. She has mild sleep apnea.

## 2014-12-28 ENCOUNTER — Ambulatory Visit (INDEPENDENT_AMBULATORY_CARE_PROVIDER_SITE_OTHER): Payer: PRIVATE HEALTH INSURANCE | Admitting: Internal Medicine

## 2014-12-28 ENCOUNTER — Encounter: Payer: Self-pay | Admitting: Internal Medicine

## 2014-12-28 VITALS — BP 102/68 | HR 78 | Ht 65.0 in | Wt 134.8 lb

## 2014-12-28 DIAGNOSIS — G4739 Other sleep apnea: Secondary | ICD-10-CM

## 2014-12-28 DIAGNOSIS — G4733 Obstructive sleep apnea (adult) (pediatric): Secondary | ICD-10-CM

## 2014-12-28 NOTE — Progress Notes (Signed)
08/12/14- [age] yoF never smoker Radiologist referred courtesy of Dr Vincente PoliGrewal for sleep medicine evaluation On meds for polychondritis. Husband has been telling her she snores, but hasn't described apnea. Worse if stuffy nose. Usually feels rested. Caffeine 2 cups/ day. No ENT surgery. Nl thyroid.  No recognized limb movement or other disturbance. Sleep schedule - usual bedtime 9-11PM, latency 2 minutes, waking 1-2x before up 5:30 AM. Two weeks/ year rotate shift to nights.  Father on CPAP.  12/28/14- 57 yoF never smoker Radiologist referred courtesy of Dr Vincente PoliGrewal for sleep medicine evaluation FOLLOWS FOR: Pt here to go over results of sleep study. Unattended home sleep study: 09/28/14  Mild obstructive and central sleep apnea 13.3/ hr Admits some easy sleepiness relative to her peers. Husband reporting less snoring.  ROS-see HPI Constitutional:   No-   weight loss, night sweats, fevers, chills, fatigue, lassitude. HEENT:   No-  headaches, difficulty swallowing, tooth/dental problems, sore throat,       No-  sneezing, itching, ear ache, nasal congestion, post nasal drip,  CV:  No-   chest pain, orthopnea, PND, swelling in lower extremities, anasarca,                                                    dizziness, palpitations Resp: No-   shortness of breath with exertion or at rest.              No-   productive cough,  No non-productive cough,  No- coughing up of blood.              No-   change in color of mucus.  No- wheezing.   Skin: No-   rash or lesions. GI:  +heartburn, indigestion, abdominal pain, nausea, vomiting,  GU:  MS:  No-   joint pain or swelling.   Neuro-     nothing unusual Psych:  No- change in mood or affect. No depression or anxiety.  No memory loss.  OBJ- Physical Exam General- Alert, Oriented, Affect-appropriate, Distress- none acute, medium build Skin- rash-none, lesions- none, excoriation- none Lymphadenopathy- none Head- atraumatic            Eyes- Gross vision intact,  PERRLA, conjunctivae and secretions clear            Ears- Hearing, canals-normal            Nose- Clear, no-Septal dev, mucus, polyps, erosion, perforation             Throat- Mallampati III , mucosa clear , drainage- none, tonsils- atrophic Neck- flexible , trachea midline, no stridor , thyroid nl, carotid no bruit Chest - symmetrical excursion , unlabored           Heart/CV- RRR , no murmur , no gallop  , no rub, nl s1 s2                           - JVD- none , edema- none, stasis changes- none, varices- none           Lung- clear to P&A, wheeze- none, cough- none , dullness-none, rub- none           Chest wall-  Abd-  Br/ Gen/ Rectal- Not done, not indicated Extrem- cyanosis- none, clubbing, none, atrophy- none, strength- nl Neuro-  grossly intact to observation

## 2014-12-28 NOTE — Patient Instructions (Signed)
Order- new DME, new CPAP  Auto 5-15  Mask of choice, humidifier, supplies dx OSA, set up [facility]

## 2015-02-17 ENCOUNTER — Telehealth: Payer: Self-pay | Admitting: Internal Medicine

## 2015-02-17 DIAGNOSIS — G4733 Obstructive sleep apnea (adult) (pediatric): Secondary | ICD-10-CM

## 2015-02-17 NOTE — Telephone Encounter (Signed)
[facility].  Patient is swallowing air without knowing it, feeling bloated in the mornings.  Respiratory therapist Dois Davenport(Sandra) [facility] suggested that [facility] be [facility].  [facility] to obtain Sandra's recommendations so we can update order.  Awaiting copy of recommendations.

## 2015-02-18 NOTE — Telephone Encounter (Signed)
[facility] and [facility] on CY's cart for review.   To Katie for follow up

## 2015-02-18 NOTE — Telephone Encounter (Signed)
Order DME- change CPAP to 10 cwp and get download after 3-4 days for pressure compliance    Dx OSA

## 2015-02-18 NOTE — Telephone Encounter (Signed)
Called and spoke to pt. Informed pt of the recs per CY. Order placed. Pt verbalized understanding and denied any further questions or concerns at this time.   

## 2015-03-29 ENCOUNTER — Encounter: Payer: Self-pay | Admitting: Internal Medicine

## 2015-03-29 ENCOUNTER — Ambulatory Visit (INDEPENDENT_AMBULATORY_CARE_PROVIDER_SITE_OTHER): Payer: PRIVATE HEALTH INSURANCE | Admitting: Internal Medicine

## 2015-03-29 VITALS — BP 114/72 | HR 58 | Ht 65.0 in | Wt 139.4 lb

## 2015-03-29 DIAGNOSIS — G4739 Other sleep apnea: Secondary | ICD-10-CM | POA: Diagnosis not present

## 2015-03-29 DIAGNOSIS — G4733 Obstructive sleep apnea (adult) (pediatric): Secondary | ICD-10-CM | POA: Diagnosis not present

## 2015-03-29 NOTE — Patient Instructions (Signed)
Order- DME [facility]- reduce CPAP pressure to 6    Dx OSA  Ok to talk with [facility] about mask style choices, when to replace mask and supplies, etc.   Please call here if we can help

## 2015-03-29 NOTE — Progress Notes (Signed)
08/12/14- [age] yoF never smoker Radiologist referred courtesy of Dr Vincente PoliGrewal for sleep medicine evaluation On meds for polychondritis. Husband has been telling her she snores, but hasn't described apnea. Worse if stuffy nose. Usually feels rested. Caffeine 2 cups/ day. No ENT surgery. Nl thyroid.  No recognized limb movement or other disturbance. Sleep schedule - usual bedtime 9-11PM, latency 2 minutes, waking 1-2x before up 5:30 AM. Two weeks/ year rotate shift to nights.  Father on CPAP.  12/28/14- 57 yoF never smoker Radiologist referred courtesy of Dr Vincente PoliGrewal for sleep medicine evaluation FOLLOWS FOR: Pt here to go over results of sleep study. Unattended home sleep study: 09/28/14  Mild obstructive and central sleep apnea 13.3/ hr Admits some easy sleepiness relative to her peers. Husband reporting less snoring.  03/29/15-57 yoF never smoker Radiologist followed for OSA/ CPAP CPAP 7/ [facility] FOLLOWS FOR: Pt states she is wearing CPAP every night for about 6-7 hours; DME is [facility]. Pressure was never increased. She says current pressure still makes her feel a little gassy. Using nasal mask and humidifier. Download confirms good compliance and control.  ROS-see HPI Constitutional:   No-   weight loss, night sweats, fevers, chills, fatigue, lassitude. HEENT:   +  headaches, difficulty swallowing, tooth/dental problems, sore throat,       No-  sneezing, itching, ear ache, nasal congestion, post nasal drip,  CV:  No-   chest pain, orthopnea, PND, swelling in lower extremities, anasarca, dizziness, palpitations Resp: No-   shortness of breath with exertion or at rest.              No-   productive cough,  + non-productive cough,  No- coughing up of blood.  No-   change in color of mucus.  No- wheezing.   Skin: No-   rash or lesions. GI:  +heartburn, indigestion, abdominal pain, nausea, vomiting,  GU:  MS:  No-   joint pain or swelling.   Neuro-     nothing unusual Psych:  No- change in mood or  affect. No depression or anxiety.  No memory loss.  OBJ- Physical Exam General- Alert, Oriented, Affect-appropriate, Distress- none acute, medium build Skin- rash-none, lesions- none, excoriation- none Lymphadenopathy- none Head- atraumatic            Eyes- Gross vision intact, PERRLA, conjunctivae and secretions clear            Ears- Hearing, canals-normal            Nose- Clear, no-Septal dev, mucus, polyps, erosion, perforation             Throat- Mallampati III , mucosa clear , drainage- none, tonsils- atrophic Neck- flexible , trachea midline, no stridor , thyroid nl, carotid no bruit Chest - symmetrical excursion , unlabored           Heart/CV- RRR , no murmur , no gallop  , no rub, nl s1 s2                           - JVD- none , edema- none, stasis changes- none, varices- none           Lung- clear to P&A, wheeze- none, cough- none , dullness-none, rub- none           Chest wall-  Abd-  Br/ Gen/ Rectal- Not done, not indicated Extrem- cyanosis- none, clubbing, none, atrophy- none, strength- nl Neuro- grossly intact to observation

## 2015-03-29 NOTE — Assessment & Plan Note (Signed)
Good compliance and control demonstrated on download from [facility] on pressure still at 7. She is swallowing some air occasionally silverware to see if we can get by with reducing CPAP pressure to 6. Goals, comfort and purpose of CPAP discussed.

## 2015-06-08 ENCOUNTER — Ambulatory Visit
Admission: RE | Admit: 2015-06-08 | Discharge: 2015-06-08 | Disposition: A | Discharge status: Home / Self Care | Payer: PRIVATE HEALTH INSURANCE | Source: Ambulatory Visit | Attending: Obstetrics and Gynecology | Admitting: Obstetrics and Gynecology

## 2015-06-08 ENCOUNTER — Other Ambulatory Visit: Payer: Self-pay | Admitting: Obstetrics and Gynecology

## 2015-06-08 DIAGNOSIS — Z1231 Encounter for screening mammogram for malignant neoplasm of breast: Secondary | ICD-10-CM

## 2015-06-08 DIAGNOSIS — N63 Unspecified lump in unspecified breast: Secondary | ICD-10-CM

## 2015-06-27 DIAGNOSIS — H2513 Age-related nuclear cataract, bilateral: Secondary | ICD-10-CM | POA: Insufficient documentation

## 2015-06-27 DIAGNOSIS — H04129 Dry eye syndrome of unspecified lacrimal gland: Secondary | ICD-10-CM | POA: Insufficient documentation

## 2015-10-03 ENCOUNTER — Ambulatory Visit: Payer: PRIVATE HEALTH INSURANCE | Admitting: Internal Medicine

## 2016-03-27 ENCOUNTER — Encounter: Payer: Self-pay | Admitting: Internal Medicine

## 2016-03-27 ENCOUNTER — Ambulatory Visit (INDEPENDENT_AMBULATORY_CARE_PROVIDER_SITE_OTHER): Payer: PRIVATE HEALTH INSURANCE | Admitting: Internal Medicine

## 2016-03-27 VITALS — BP 118/66 | HR 56 | Ht 65.0 in | Wt 136.4 lb

## 2016-03-27 DIAGNOSIS — G4733 Obstructive sleep apnea (adult) (pediatric): Secondary | ICD-10-CM | POA: Diagnosis not present

## 2016-03-27 DIAGNOSIS — G4739 Other sleep apnea: Secondary | ICD-10-CM | POA: Diagnosis not present

## 2016-03-27 NOTE — Progress Notes (Signed)
08/12/14- 89 yoF never smoker Radiologist referred courtesy of Dr Vincente PoliGrewal for sleep medicine evaluation On meds for polychondritis. Husband has been telling her she snores, but hasn't described apnea. Worse if stuffy nose. Usually feels rested. Caffeine 2 cups/ day. No ENT surgery. Nl thyroid.  No recognized limb movement or other disturbance. Sleep schedule - usual bedtime 9-11PM, latency 2 minutes, waking 1-2x before up 5:30 AM. Two weeks/ year rotate shift to nights.  Father on CPAP.  12/28/14- 57 yoF never smoker Radiologist referred courtesy of Dr Vincente PoliGrewal for sleep medicine evaluation FOLLOWS FOR: Pt here to go over results of sleep study. Unattended home sleep study: 09/28/14  Mild obstructive and central sleep apnea 13.3/ hr Admits some easy sleepiness relative to her peers. Husband reporting less snoring.  03/29/15-48 yoF never smoker Radiologist followed for OSA/ CPAP CPAP 7/ [facility] FOLLOWS FOR: Pt states she is wearing CPAP every night for about 6-7 hours; DME is [facility]. Pressure was never increased. She says current pressure still makes her feel a little gassy. Using nasal mask and humidifier. Download confirms good compliance and control.  03/27/2016-50 year old female never smoker Radiologist, followed for OSA/CPAP CPAP 6/ [facility] FOLLOWS ZOX:WRUEAFOR:Doing well with CPAP. Wears each night avg. of 7 hrs.Pr. Good. Download shows adequate compliance-77% at four-hour minimum goal with excellent control, pressure 6. We discussed comfort and convenience. She doesn't take it when she travels. She asks about simpler options. We discussed portable CPAP machines and oral appliances. I think she might be a very good candidate for an oral appliance. We also discussed surgery.  ROS-see HPI Constitutional:   No-   weight loss, night sweats, fevers, chills, fatigue, lassitude. HEENT:   +  headaches, difficulty swallowing, tooth/dental problems, sore throat,       No-  sneezing, itching, ear ache, nasal  congestion, post nasal drip,  CV:  No-   chest pain, orthopnea, PND, swelling in lower extremities, anasarca, dizziness, palpitations Resp: No-   shortness of breath with exertion or at rest.              No-   productive cough,  + non-productive cough,  No- coughing up of blood.  No-   change in color of mucus.  No- wheezing.   Skin: No-   rash or lesions. GI:  +heartburn, indigestion, abdominal pain, nausea, vomiting,  GU:  MS:  No-   joint pain or swelling.   Neuro-     nothing unusual Psych:  No- change in mood or affect. No depression or anxiety.  No memory loss.  OBJ- Physical Exam General- Alert, Oriented, Affect-appropriate, Distress- none acute, medium build Skin- rash-none, lesions- none, excoriation- none Lymphadenopathy- none Head- atraumatic            Eyes- Gross vision intact, PERRLA, conjunctivae and secretions clear            Ears- Hearing, canals-normal            Nose- Clear, no-Septal dev, mucus, polyps, erosion, perforation             Throat- Mallampati III , mucosa clear , drainage- none, tonsils- atrophic Neck- flexible , trachea midline, no stridor , thyroid nl, carotid no bruit Chest - symmetrical excursion , unlabored           Heart/CV- RRR , no murmur , no gallop  , no rub, nl s1 s2                           -  JVD- none , edema- none, stasis changes- none, varices- none           Lung- clear to P&A, wheeze- none, cough- none , dullness-none, rub- none           Chest wall-  Abd-  Br/ Gen/ Rectal- Not done, not indicated Extrem- cyanosis- none, clubbing, none, atrophy- none, strength- nl Neuro- grossly intact to observation

## 2016-03-27 NOTE — Assessment & Plan Note (Signed)
We reviewed treatment options. She would like to explore the oral appliance option and is being referred to Dr. Myrtis SerKatz

## 2016-03-27 NOTE — Patient Instructions (Signed)
We can continue CPAP 6/ [facility]  Order- referral to orthodontist Dr Althea GrimmerMark Katz   Consider oral appliance for OSA

## 2016-04-06 ENCOUNTER — Encounter: Payer: Self-pay | Admitting: Internal Medicine

## 2016-06-11 ENCOUNTER — Ambulatory Visit
Admission: RE | Admit: 2016-06-11 | Discharge: 2016-06-11 | Disposition: A | Discharge status: Home / Self Care | Payer: PRIVATE HEALTH INSURANCE | Source: Ambulatory Visit | Attending: Obstetrics and Gynecology | Admitting: Obstetrics and Gynecology

## 2016-06-11 ENCOUNTER — Other Ambulatory Visit: Payer: Self-pay | Admitting: Obstetrics and Gynecology

## 2016-06-11 ENCOUNTER — Other Ambulatory Visit: Payer: Self-pay

## 2016-06-11 DIAGNOSIS — R928 Other abnormal and inconclusive findings on diagnostic imaging of breast: Secondary | ICD-10-CM

## 2016-06-11 DIAGNOSIS — Z1231 Encounter for screening mammogram for malignant neoplasm of breast: Secondary | ICD-10-CM

## 2016-06-13 ENCOUNTER — Ambulatory Visit
Admission: RE | Admit: 2016-06-13 | Discharge: 2016-06-13 | Disposition: A | Discharge status: Home / Self Care | Payer: PRIVATE HEALTH INSURANCE | Source: Ambulatory Visit | Attending: Obstetrics and Gynecology | Admitting: Obstetrics and Gynecology

## 2016-06-29 ENCOUNTER — Other Ambulatory Visit: Payer: Self-pay | Admitting: General Surgery

## 2016-06-29 ENCOUNTER — Encounter (HOSPITAL_BASED_OUTPATIENT_CLINIC_OR_DEPARTMENT_OTHER): Payer: Self-pay | Admitting: *Deleted

## 2016-06-29 DIAGNOSIS — R928 Other abnormal and inconclusive findings on diagnostic imaging of breast: Secondary | ICD-10-CM

## 2016-07-05 ENCOUNTER — Ambulatory Visit
Admission: RE | Admit: 2016-07-05 | Discharge: 2016-07-05 | Disposition: A | Discharge status: Home / Self Care | Payer: PRIVATE HEALTH INSURANCE | Source: Ambulatory Visit | Attending: General Surgery | Admitting: General Surgery

## 2016-07-05 DIAGNOSIS — R928 Other abnormal and inconclusive findings on diagnostic imaging of breast: Secondary | ICD-10-CM

## 2016-07-05 NOTE — Progress Notes (Signed)
Pt given 8oz carton of boost breeze with written and verbal instructions to drink by 630 on morning of surgery and no other liquid after midnight,  Teach back, pt voiced understanding

## 2016-07-05 NOTE — H&P (Signed)
History of Present Illness Emily Fisher(Emily Schwering T. Holmes Hays MD; 06/29/2016 9:37 AM) The patient is a 50 year old female who presents with a complaint of Breast problems. Emily Fisher comes to the office due to a recent abnormal mammogram. She has a history of dense breast tissue and fibrocystic changes and has had a benign core biopsy on the left previously. Most recent mammogram showed a possible developing density in the central posterior left breast. Subsequent diagnostic mammogram showed a persistent area of distortion or density posteriorly at the 12:30 position appearing about a centimeter and a half. A large core needle biopsy was performed showing pseudo-angiomatous stromal hyperplasia and fibrocystic change and this was felt to be possibly discordant. She has not noted any breast symptoms, specifically lump or nipple discharge or skin changes. She has a family history of breast cancer and a maternal grandmother and 2 great aunts. No hormonal medications.   Other Problems Emily Fisher(Emily Fisher, CMA; 06/29/2016 9:09 AM) Anxiety Disorder Gastroesophageal Reflux Disease Hemorrhoids Migraine Headache Other disease, cancer, significant illness Sleep Apnea  Past Surgical History Emily Fisher(Emily Fisher, CMA; 06/29/2016 9:09 AM) Breast Biopsy Bilateral. Breast Mass; Local Excision Right. Hemorrhoidectomy Oral Surgery  Diagnostic Studies History Emily Fisher(Emily [place]; 06/29/2016 9:09 AM) Colonoscopy 1-5 years ago Mammogram within last year Pap Smear 1-5 years ago  Allergies Emily Fisher(Emily Fisher, CMA; 06/29/2016 9:09 AM) No Known Drug Allergies09/10/2015  Medication History Emily Fisher(Emily Fisher, CMA; 06/29/2016 9:12 AM) Emily NancyViberzi (75MG  Tablet, Oral) Active. Allegra (60MG  Capsule, Oral) Active. Calcium Carbonate (600MG  Tablet, Oral two times daily) Active. Lexapro (20MG  Tablet, Oral) Active. Mycophenolate Sodium (360MG  Tablet DR, Oral) Active. Omeprazole (20MG  Capsule DR, Oral) Active. Propranolol HCl (60MG  Tablet, Oral)  Active. Topiramate (50MG  Tablet, Oral) Active. Vitamin C (1000MG  Tablet, Oral) Active. Vitamin D (1000UNIT Tablet, Oral) Active. Medications Reconciled  Social History Emily Fisher(Emily [place], [place]; 06/29/2016 9:09 AM) Alcohol use Occasional alcohol use. Caffeine use Coffee, Tea. No drug use Tobacco use Never smoker.  Family History Emily Fisher(Emily [place], [place]; 06/29/2016 9:09 AM) Heart disease in female family member before age 52 Hypertension Father. Migraine Headache Father. Respiratory Condition Daughter.  Pregnancy / Birth History Emily Fisher(Emily Fisher, CMA; 06/29/2016 9:09 AM) Age at menarche 11 years. Gravida 3 Irregular periods Length (months) of breastfeeding 3-6 Maternal age 52-30 Para 2    Review of Systems Emily Fisher(Emily Fisher CMA; 06/29/2016 9:09 AM) General Not Present- Appetite Loss, Chills, Fatigue, Fever, Night Sweats, Weight Gain and Weight Loss. Skin Not Present- Change in Wart/Mole, Dryness, Hives, Jaundice, New Lesions, Non-Healing Wounds, Rash and Ulcer. HEENT Present- Wears glasses/contact lenses. Not Present- Earache, Hearing Loss, Hoarseness, Nose Bleed, Oral Ulcers, Ringing in the Ears, Seasonal Allergies, Sinus Pain, Sore Throat, Visual Disturbances and Yellow Eyes. Respiratory Present- Snoring. Not Present- Bloody sputum, Chronic Cough, Difficulty Breathing and Wheezing. Breast Not Present- Breast Mass, Breast Pain, Nipple Discharge and Skin Changes. Cardiovascular Not Present- Chest Pain, Difficulty Breathing Lying Down, Leg Cramps, Palpitations, Rapid Heart Rate, Shortness of Breath and Swelling of Extremities. Gastrointestinal Not Present- Abdominal Pain, Bloating, Bloody Stool, Change in Bowel Habits, Chronic diarrhea, Constipation, Difficulty Swallowing, Excessive gas, Gets full quickly at meals, Hemorrhoids, Indigestion, Nausea, Rectal Pain and Vomiting. Female Genitourinary Not Present- Frequency, Nocturia, Painful Urination, Pelvic Pain and Urgency. Musculoskeletal Not  Present- Back Pain, Joint Pain, Joint Stiffness, Muscle Pain, Muscle Weakness and Swelling of Extremities. Neurological Present- Headaches. Not Present- Decreased Memory, Fainting, Numbness, Seizures, Tingling, Tremor, Trouble walking and Weakness. Psychiatric Not Present- Anxiety, Bipolar, Change in Sleep Pattern, Depression, Fearful and Frequent crying. Endocrine Not  Present- Cold Intolerance, Excessive Hunger, Hair Changes, Heat Intolerance, Hot flashes and New Diabetes. Hematology Not Present- Blood Thinners, Easy Bruising, Excessive bleeding, Gland problems, HIV and Persistent Infections.  Vitals Emily Fisher CMA; 06/29/2016 9:12 AM) 06/29/2016 9:12 AM Weight: 131 lb Height: 65in Body Surface Area: 1.65 m Body Mass Index: 21.8 kg/m  Temp.: 97.32F  Pulse: 63 (Regular)  BP: 124/68 (Sitting, Left Arm, Standard)       Physical Exam Emily Fisher T. Emily Trembath MD; 06/29/2016 9:38 AM) The physical exam findings are as follows: Note:General: Alert, well-developed and well nourished Caucasian female, in no distress Skin: Warm and dry without rash or infection. HEENT: No palpable masses or thyromegaly. Sclera nonicteric. Breasts: Dense breast tissue bilaterally with slight irregularities. No dominant mass. Slight firmness upper left breast consistent with postbiopsy Lymph nodes: No cervical, supraclavicular, or inguinal nodes palpable. Lungs: Breath sounds clear and equal. No wheezing or increased work of breathing. Cardiovascular: Regular rate and rhythm without murmer. No JVD or edema. Neurologic: Alert and fully oriented. Gait normal. No focal weakness. Psychiatric: Normal mood and affect. Thought content appropriate with normal judgement and insight  General Mental Status-Alert. General Appearance-Consistent with stated age. Hydration-Well hydrated. Voice-Normal.    Assessment & Plan Emily Fisher T. Parag Dorton MD; 06/29/2016 9:40 AM) ABNORMAL MAMMOGRAM OF LEFT BREAST  (R92.8) Impression: Abnormal mammogram showing new focal asymmetry posteriorly at the 12:30 position and large core needle biopsy showing pseudo-angiomatous stromal hyperplasia felt to be possibly discordant. We reviewed the mammograms. Discussed options of excision versus imaging follow-up and she would like to have this removed which would be standard recommendation. I discussed the nature of surgery and risks of bleeding and infection and anesthetic complications and all questions answered. Current Plans Pt Education - [facility] Breast Biopsy HCI: discussed with patient and provided information. Radioactive seed localized left breast lumpectomy under general anesthesia as an outpatient

## 2016-07-06 ENCOUNTER — Ambulatory Visit
Admission: RE | Admit: 2016-07-06 | Discharge: 2016-07-06 | Disposition: A | Discharge status: Home / Self Care | Payer: PRIVATE HEALTH INSURANCE | Source: Ambulatory Visit | Attending: General Surgery | Admitting: General Surgery

## 2016-07-06 ENCOUNTER — Ambulatory Visit (HOSPITAL_BASED_OUTPATIENT_CLINIC_OR_DEPARTMENT_OTHER)
Admission: RE | Admit: 2016-07-06 | Discharge: 2016-07-06 | Disposition: A | Discharge status: Home / Self Care | Payer: PRIVATE HEALTH INSURANCE | Source: Ambulatory Visit | Attending: General Surgery | Admitting: General Surgery

## 2016-07-06 ENCOUNTER — Encounter (HOSPITAL_BASED_OUTPATIENT_CLINIC_OR_DEPARTMENT_OTHER): Payer: Self-pay | Admitting: Certified Registered"

## 2016-07-06 ENCOUNTER — Ambulatory Visit (HOSPITAL_BASED_OUTPATIENT_CLINIC_OR_DEPARTMENT_OTHER): Payer: PRIVATE HEALTH INSURANCE | Admitting: Certified Registered"

## 2016-07-06 ENCOUNTER — Encounter (HOSPITAL_BASED_OUTPATIENT_CLINIC_OR_DEPARTMENT_OTHER)
Admission: RE | Disposition: A | Discharge status: Home / Self Care | Payer: Self-pay | Source: Ambulatory Visit | Attending: General Surgery

## 2016-07-06 DIAGNOSIS — N6022 Fibroadenosis of left breast: Secondary | ICD-10-CM | POA: Diagnosis not present

## 2016-07-06 DIAGNOSIS — G4730 Sleep apnea, unspecified: Secondary | ICD-10-CM | POA: Diagnosis not present

## 2016-07-06 DIAGNOSIS — Z803 Family history of malignant neoplasm of breast: Secondary | ICD-10-CM | POA: Insufficient documentation

## 2016-07-06 DIAGNOSIS — R928 Other abnormal and inconclusive findings on diagnostic imaging of breast: Secondary | ICD-10-CM | POA: Diagnosis present

## 2016-07-06 DIAGNOSIS — K219 Gastro-esophageal reflux disease without esophagitis: Secondary | ICD-10-CM | POA: Insufficient documentation

## 2016-07-06 HISTORY — DX: Unspecified lump in the left breast, unspecified quadrant: N63.20

## 2016-07-06 HISTORY — DX: Sleep apnea, unspecified: G47.30

## 2016-07-06 HISTORY — PX: BREAST LUMPECTOMY WITH RADIOACTIVE SEED LOCALIZATION: SHX6424

## 2016-07-06 HISTORY — DX: Irritable bowel syndrome, unspecified: K58.9

## 2016-07-06 SURGERY — BREAST LUMPECTOMY WITH RADIOACTIVE SEED LOCALIZATION
Anesthesia: General | Laterality: Left

## 2016-07-06 MED ORDER — MIDAZOLAM HCL 2 MG/2ML IJ SOLN
1.0000 mg | INTRAMUSCULAR | Status: DC | PRN
  Administered 2016-07-06: 2 mg via INTRAVENOUS

## 2016-07-06 MED ORDER — FENTANYL CITRATE (PF) 100 MCG/2ML IJ SOLN
INTRAMUSCULAR | Status: AC
  Filled 2016-07-06: qty 2

## 2016-07-06 MED ORDER — GLYCOPYRROLATE 0.2 MG/ML IJ SOLN: 0.2000 mg | Freq: Once | INTRAMUSCULAR | Status: DC | PRN

## 2016-07-06 MED ORDER — LACTATED RINGERS IV SOLN
INTRAVENOUS | Status: DC
  Administered 2016-07-06 (×2): via INTRAVENOUS

## 2016-07-06 MED ORDER — SCOPOLAMINE 1 MG/3DAYS TD PT72: 1.0000 | MEDICATED_PATCH | Freq: Once | TRANSDERMAL | Status: DC | PRN

## 2016-07-06 MED ORDER — FENTANYL CITRATE (PF) 100 MCG/2ML IJ SOLN: 25.0000 ug | INTRAMUSCULAR | Status: DC | PRN

## 2016-07-06 MED ORDER — LIDOCAINE HCL (CARDIAC) 20 MG/ML IV SOLN
INTRAVENOUS | Status: DC | PRN
  Administered 2016-07-06: 60 mg via INTRAVENOUS

## 2016-07-06 MED ORDER — ONDANSETRON HCL 4 MG/2ML IJ SOLN
INTRAMUSCULAR | Status: AC
  Filled 2016-07-06: qty 2

## 2016-07-06 MED ORDER — BUPIVACAINE-EPINEPHRINE (PF) 0.5% -1:200000 IJ SOLN
INTRAMUSCULAR | Status: DC | PRN
  Administered 2016-07-06: 20 mL

## 2016-07-06 MED ORDER — PROMETHAZINE HCL 25 MG/ML IJ SOLN: 6.2500 mg | INTRAMUSCULAR | Status: DC | PRN

## 2016-07-06 MED ORDER — ONDANSETRON HCL 4 MG/2ML IJ SOLN
INTRAMUSCULAR | Status: DC | PRN
  Administered 2016-07-06: 4 mg via INTRAVENOUS

## 2016-07-06 MED ORDER — ACETAMINOPHEN 500 MG PO TABS
1000.0000 mg | ORAL_TABLET | ORAL | Status: AC
  Administered 2016-07-06: 1000 mg via ORAL

## 2016-07-06 MED ORDER — CELECOXIB 200 MG PO CAPS
ORAL_CAPSULE | ORAL | Status: AC
  Filled 2016-07-06: qty 2

## 2016-07-06 MED ORDER — CELECOXIB 400 MG PO CAPS
400.0000 mg | ORAL_CAPSULE | ORAL | Status: AC
  Administered 2016-07-06: 400 mg via ORAL

## 2016-07-06 MED ORDER — CEFAZOLIN SODIUM-DEXTROSE 2-4 GM/100ML-% IV SOLN
INTRAVENOUS | Status: AC
  Filled 2016-07-06: qty 100

## 2016-07-06 MED ORDER — GABAPENTIN 300 MG PO CAPS
300.0000 mg | ORAL_CAPSULE | ORAL | Status: AC
  Administered 2016-07-06: 300 mg via ORAL

## 2016-07-06 MED ORDER — CHLORHEXIDINE GLUCONATE CLOTH 2 % EX PADS: 6.0000 | MEDICATED_PAD | Freq: Once | CUTANEOUS | Status: DC

## 2016-07-06 MED ORDER — DEXAMETHASONE SODIUM PHOSPHATE 10 MG/ML IJ SOLN
INTRAMUSCULAR | Status: AC
  Filled 2016-07-06: qty 1

## 2016-07-06 MED ORDER — DEXAMETHASONE SODIUM PHOSPHATE 4 MG/ML IJ SOLN
INTRAMUSCULAR | Status: DC | PRN
  Administered 2016-07-06: 10 mg via INTRAVENOUS

## 2016-07-06 MED ORDER — 0.9 % SODIUM CHLORIDE (POUR BTL) OPTIME
TOPICAL | Status: DC | PRN
  Administered 2016-07-06: 200 mL

## 2016-07-06 MED ORDER — MIDAZOLAM HCL 2 MG/2ML IJ SOLN
INTRAMUSCULAR | Status: AC
  Filled 2016-07-06: qty 2

## 2016-07-06 MED ORDER — LIDOCAINE 2% (20 MG/ML) 5 ML SYRINGE
INTRAMUSCULAR | Status: AC
  Filled 2016-07-06: qty 5

## 2016-07-06 MED ORDER — EPHEDRINE SULFATE 50 MG/ML IJ SOLN
INTRAMUSCULAR | Status: DC | PRN
  Administered 2016-07-06 (×3): 10 mg via INTRAVENOUS

## 2016-07-06 MED ORDER — FENTANYL CITRATE (PF) 100 MCG/2ML IJ SOLN
50.0000 ug | INTRAMUSCULAR | Status: DC | PRN
  Administered 2016-07-06 (×2): 50 ug via INTRAVENOUS

## 2016-07-06 MED ORDER — PROPOFOL 10 MG/ML IV BOLUS
INTRAVENOUS | Status: DC | PRN
  Administered 2016-07-06: 150 mg via INTRAVENOUS

## 2016-07-06 MED ORDER — PROPOFOL 10 MG/ML IV BOLUS
INTRAVENOUS | Status: AC
  Filled 2016-07-06: qty 20

## 2016-07-06 MED ORDER — BUPIVACAINE-EPINEPHRINE (PF) 0.5% -1:200000 IJ SOLN
INTRAMUSCULAR | Status: AC
  Filled 2016-07-06: qty 30

## 2016-07-06 MED ORDER — CEFAZOLIN SODIUM-DEXTROSE 2-4 GM/100ML-% IV SOLN
2.0000 g | INTRAVENOUS | Status: AC
  Administered 2016-07-06: 2 g via INTRAVENOUS

## 2016-07-06 MED ORDER — HYDROCODONE-ACETAMINOPHEN 5-325 MG PO TABS: 1.0000 | ORAL_TABLET | ORAL | 0 refills | Status: DC | PRN

## 2016-07-06 MED ORDER — ACETAMINOPHEN 500 MG PO TABS
ORAL_TABLET | ORAL | Status: AC
  Filled 2016-07-06: qty 2

## 2016-07-06 MED ORDER — GABAPENTIN 300 MG PO CAPS
ORAL_CAPSULE | ORAL | Status: AC
  Filled 2016-07-06: qty 1

## 2016-07-06 SURGICAL SUPPLY — 47 items
BINDER BREAST LRG (GAUZE/BANDAGES/DRESSINGS) IMPLANT
BINDER BREAST MEDIUM (GAUZE/BANDAGES/DRESSINGS) ×1 IMPLANT
BINDER BREAST XLRG (GAUZE/BANDAGES/DRESSINGS) IMPLANT
BINDER BREAST XXLRG (GAUZE/BANDAGES/DRESSINGS) IMPLANT
BLADE SURG 15 STRL LF DISP TIS (BLADE) ×1 IMPLANT
BLADE SURG 15 STRL SS (BLADE) ×4
CANISTER SUC SOCK COL 7IN (MISCELLANEOUS) IMPLANT
CANISTER SUCT 1200ML W/VALVE (MISCELLANEOUS) IMPLANT
CHLORAPREP W/TINT 26ML (MISCELLANEOUS) ×2 IMPLANT
CLIP TI WIDE RED SMALL 6 (CLIP) IMPLANT
COVER BACK TABLE 60X90IN (DRAPES) ×2 IMPLANT
COVER MAYO STAND STRL (DRAPES) ×2 IMPLANT
COVER PROBE W GEL 5X96 (DRAPES) ×2 IMPLANT
DECANTER SPIKE VIAL GLASS SM (MISCELLANEOUS) IMPLANT
DEVICE DUBIN W/COMP PLATE 8390 (MISCELLANEOUS) ×2 IMPLANT
DRAPE LAPAROSCOPIC ABDOMINAL (DRAPES) ×2 IMPLANT
DRAPE UTILITY XL STRL (DRAPES) ×2 IMPLANT
ELECT COATED BLADE 2.86 ST (ELECTRODE) ×2 IMPLANT
ELECT REM PT RETURN 9FT ADLT (ELECTROSURGICAL) ×2
ELECTRODE REM PT RTRN 9FT ADLT (ELECTROSURGICAL) ×1 IMPLANT
GLOVE BIOGEL PI IND STRL 7.0 (GLOVE) IMPLANT
GLOVE BIOGEL PI IND STRL 8 (GLOVE) ×1 IMPLANT
GLOVE BIOGEL PI INDICATOR 7.0 (GLOVE) ×2
GLOVE BIOGEL PI INDICATOR 8 (GLOVE) ×1
GLOVE ECLIPSE 6.5 STRL STRAW (GLOVE) ×1 IMPLANT
GLOVE ECLIPSE 7.5 STRL STRAW (GLOVE) ×2 IMPLANT
GOWN STRL REUS W/ TWL LRG LVL3 (GOWN DISPOSABLE) ×1 IMPLANT
GOWN STRL REUS W/ TWL XL LVL3 (GOWN DISPOSABLE) ×1 IMPLANT
GOWN STRL REUS W/TWL LRG LVL3 (GOWN DISPOSABLE) ×2
GOWN STRL REUS W/TWL XL LVL3 (GOWN DISPOSABLE) ×2
ILLUMINATOR WAVEGUIDE N/F (MISCELLANEOUS) ×1 IMPLANT
KIT MARKER MARGIN INK (KITS) ×2 IMPLANT
LIQUID BAND (GAUZE/BANDAGES/DRESSINGS) ×2 IMPLANT
NDL HYPO 25X1 1.5 SAFETY (NEEDLE) ×1 IMPLANT
NEEDLE HYPO 25X1 1.5 SAFETY (NEEDLE) ×2 IMPLANT
NS IRRIG 1000ML POUR BTL (IV SOLUTION) IMPLANT
PACK BASIN DAY SURGERY FS (CUSTOM PROCEDURE TRAY) ×2 IMPLANT
PENCIL BUTTON HOLSTER BLD 10FT (ELECTRODE) ×2 IMPLANT
SLEEVE SCD COMPRESS KNEE MED (MISCELLANEOUS) ×2 IMPLANT
SPONGE LAP 4X18 X RAY DECT (DISPOSABLE) ×2 IMPLANT
SUT MON AB 5-0 PS2 18 (SUTURE) ×2 IMPLANT
SUT VICRYL 3-0 CR8 SH (SUTURE) ×2 IMPLANT
SYR CONTROL 10ML LL (SYRINGE) ×2 IMPLANT
TOWEL OR 17X24 6PK STRL BLUE (TOWEL DISPOSABLE) ×2 IMPLANT
TOWEL OR NON WOVEN STRL DISP B (DISPOSABLE) ×2 IMPLANT
TUBE CONNECTING 20X1/4 (TUBING) IMPLANT
YANKAUER SUCT BULB TIP NO VENT (SUCTIONS) IMPLANT

## 2016-07-06 NOTE — Discharge Instructions (Signed)
[facility] °Office Phone Number [phone] ° °BREAST BIOPSY/ PARTIAL MASTECTOMY: POST OP INSTRUCTIONS ° °Always review your discharge instruction sheet given to you by the facility where your surgery was performed. ° °IF YOU HAVE DISABILITY OR FAMILY LEAVE FORMS, YOU MUST BRING THEM TO THE OFFICE FOR PROCESSING.  DO NOT GIVE THEM TO YOUR DOCTOR. ° °1. A prescription for pain medication may be given to you upon discharge.  Take your pain medication as prescribed, if needed.  If narcotic pain medicine is not needed, then you may take acetaminophen (Tylenol) or ibuprofen (Advil) as needed. °2. Take your usually prescribed medications unless otherwise directed °3. If you need a refill on your pain medication, please contact your pharmacy.  They will contact our office to request authorization.  Prescriptions will not be filled after 5pm or on week-ends. °4. You should eat very light the first 24 hours after surgery, such as soup, crackers, pudding, etc.  Resume your normal diet the day after surgery. °5. Most patients will experience some swelling and bruising in the breast.  Ice packs and a good support bra will help.  Swelling and bruising can take several days to resolve.  °6. It is common to experience some constipation if taking pain medication after surgery.  Increasing fluid intake and taking a stool softener will usually help or prevent this problem from occurring.  A mild laxative (Milk of Magnesia or Miralax) should be taken according to package directions if there are no bowel movements after 48 hours. °7. Unless discharge instructions indicate otherwise, you may remove your bandages 24-48 hours after surgery, and you may shower at that time.  You may have steri-strips (small skin tapes) in place directly over the incision.  These strips should be left on the skin for 7-10 days.  If your surgeon used skin glue on the incision, you may shower in 24 hours.  The glue will flake off over the  next 2-3 weeks.  Any sutures or staples will be removed at the office during your follow-up visit. °8. ACTIVITIES:  You may resume regular daily activities (gradually increasing) beginning the next day.  Wearing a good support bra or sports bra minimizes pain and swelling.  You may have sexual intercourse when it is comfortable. °a. You may drive when you no longer are taking prescription pain medication, you can comfortably wear a seatbelt, and you can safely maneuver your car and apply brakes. °b. RETURN TO WORK:  ______________________________________________________________________________________ °9. You should see your doctor in the office for a follow-up appointment approximately two weeks after your surgery.  Your doctor’s nurse will typically make your follow-up appointment when she calls you with your pathology report.  Expect your pathology report 2-3 business days after your surgery.  You may call to check if you do not hear from us after three days. °10. OTHER INSTRUCTIONS: _______________________________________________________________________________________________ _____________________________________________________________________________________________________________________________________ °_____________________________________________________________________________________________________________________________________ °_____________________________________________________________________________________________________________________________________ ° °WHEN TO CALL YOUR DOCTOR: °1. Fever over 101.0 °2. Nausea and/or vomiting. °3. Extreme swelling or bruising. °4. Continued bleeding from incision. °5. Increased pain, redness, or drainage from the incision. ° °The clinic staff is available to answer your questions during regular business hours.  Please don’t hesitate to call and ask to speak to one of the nurses for clinical concerns.  If you have a medical emergency, go to the nearest  emergency room or call 911.  A surgeon from [facility] is always on call at the hospital. ° °For further questions, please visit [email or web address]  ° ° ° °  Post Anesthesia Home Care Instructions ° °Activity: °Get plenty of rest for the remainder of the day. A responsible adult should stay with you for 24 hours following the procedure.  °For the next 24 hours, DO NOT: °-Drive a car °-Operate machinery °-Drink alcoholic beverages °-Take any medication unless instructed by your physician °-Make any legal decisions or sign important papers. ° °Meals: °Start with liquid foods such as gelatin or soup. Progress to regular foods as tolerated. Avoid greasy, spicy, heavy foods. If nausea and/or vomiting occur, drink only clear liquids until the nausea and/or vomiting subsides. Call your physician if vomiting continues. ° °Special Instructions/Symptoms: °Your throat may feel dry or sore from the anesthesia or the breathing tube placed in your throat during surgery. If this causes discomfort, gargle with warm salt water. The discomfort should disappear within 24 hours. ° °If you had a scopolamine patch placed behind your ear for the management of post- operative nausea and/or vomiting: ° °1. The medication in the patch is effective for 72 hours, after which it should be removed.  Wrap patch in a tissue and discard in the trash. Wash hands thoroughly with soap and water. °2. You may remove the patch earlier than 72 hours if you experience unpleasant side effects which may include dry mouth, dizziness or visual disturbances. °3. Avoid touching the patch. Wash your hands with soap and water after contact with the patch. °  ° °

## 2016-07-06 NOTE — Anesthesia Procedure Notes (Signed)
Procedure Name: LMA Insertion Date/Time: 07/06/2016 9:35 AM Performed by: Colten Desroches D Pre-anesthesia Checklist: Patient identified, Emergency Drugs available, Suction available and Patient being monitored Patient Re-evaluated:Patient Re-evaluated prior to inductionOxygen Delivery Method: Circle system utilized Preoxygenation: Pre-oxygenation with 100% oxygen Intubation Type: IV induction Ventilation: Mask ventilation without difficulty LMA: LMA inserted LMA Size: 4.0 Number of attempts: 1 Airway Equipment and Method: Bite block Placement Confirmation: positive ETCO2 Tube secured with: Tape Dental Injury: Teeth and Oropharynx as per pre-operative assessment

## 2016-07-06 NOTE — Anesthesia Preprocedure Evaluation (Addendum)
Anesthesia Evaluation  Patient identified by MRN, date of birth, ID band Patient awake    Reviewed: Allergy & Precautions, NPO status , Patient's Chart, lab work & pertinent test results  History of Anesthesia Complications Negative for: history of anesthetic complications  Airway Mallampati: III  TM Distance: >3 FB Neck ROM: Full    Dental  (+) Teeth Intact, Dental Advisory Given   Pulmonary neg shortness of breath, sleep apnea and Continuous Positive Airway Pressure Ventilation , neg COPD, Recent URI , Resolved,    Pulmonary exam normal breath sounds clear to auscultation       Cardiovascular (-) hypertension(-) angina(-) Past MI and (-) Cardiac Stents negative cardio ROS   Rhythm:Regular Rate:Normal     Neuro/Psych  Headaches,    GI/Hepatic Neg liver ROS, GERD  Medicated and Controlled,  Endo/Other  negative endocrine ROS  Renal/GU negative Renal ROS     Musculoskeletal   Abdominal   Peds  Hematology negative hematology ROS (+)   Anesthesia Other Findings   Reproductive/Obstetrics                            Anesthesia Physical Anesthesia Plan  ASA: III  Anesthesia Plan: General   Post-op Pain Management:    Induction: Intravenous  Airway Management Planned: LMA  Additional Equipment:   Intra-op Plan:   Post-operative Plan: Extubation in OR  Informed Consent: I have reviewed the patients History and Physical, chart, labs and discussed the procedure including the risks, benefits and alternatives for the proposed anesthesia with the patient or authorized representative who has indicated his/her understanding and acceptance.   Dental advisory given  Plan Discussed with: CRNA  Anesthesia Plan Comments: (Risks of general anesthesia discussed including, but not limited to, sore throat, hoarse voice, chipped/damaged teeth, injury to vocal cords, nausea and vomiting, allergic  reactions, lung infection, heart attack, stroke, and death. All questions answered. )        Anesthesia Quick Evaluation

## 2016-07-06 NOTE — Transfer of Care (Signed)
Immediate Anesthesia Transfer of Care Note  Patient: Emily Fisher  Procedure(s) Performed: Procedure(s): LEFT BREAST LUMPECTOMY WITH RADIOACTIVE SEED LOCALIZATION (Left)  Patient Location: PACU  Anesthesia Type:General  Level of Consciousness: awake, alert , oriented and patient cooperative  Airway & Oxygen Therapy: Patient Spontanous Breathing and Patient connected to face mask oxygen  Post-op Assessment: Report given to RN and Post -op Vital signs reviewed and stable  Post vital signs: Reviewed and stable  Last Vitals:  Vitals:   07/06/16 0746  BP: 97/72  Pulse: 70  Resp: 18  Temp: 36.6 C    Last Pain:  Vitals:   07/06/16 0746  TempSrc: Oral      Patients Stated Pain Goal: 0 (07/06/16 0746)  Complications: No apparent anesthesia complications

## 2016-07-06 NOTE — Op Note (Signed)
Preoperative Diagnosis: ABNORMAL MAMMOGRAM LEFT BREAST  Postoprative Diagnosis: ABNORMAL MAMMOGRAM LEFT BREAST  Procedure: Procedure(s): LEFT BREAST LUMPECTOMY WITH RADIOACTIVE SEED LOCALIZATION   Surgeon: Glenna FellowsHoxworth, Kerrington Sova T   Assistants: None  Anesthesia:  General LMA anesthesia  Indications: She has had a recent abnormal screening mammogram with a area of density and distortion in the posterior breast superiorly at the 12:30 position. Core biopsy has revealed PA SH and fibrocystic change which was felt to be possibly discordant. After consultation and discussion regarding options we elected to proceed with radioactive seed localized excision of this area.    Procedure Detail:  Patient did previously undergone placement of a radioactive seed at the area of density. See placement was confirmed in the holding area. She was brought to the operating room, placed in the supine position on the operating table, and laryngeal mask general anesthesia induced. The right breast was widely sterilely prepped and draped. She received preoperative IV antibiotics. PAS were in place. Patient timeout was performed and correct procedure verified. Location of the seed was identified with the neoprobe and marked at the 12:00 position superiorly. I made a circumareolar incision superiorly and using the Invuity retractor a skin and subcutaneous flap was raised superiorly extending over the area of high counts. Using the neoprobe for guidance I then excised an approximately 2 cm specimen of tissue down to the chest wall. During the dissection the seed was displaced and was placed in a specimen cup and separately x-rayed and confirmed. The area was excised down to the chest wall and there was some dense firm tissue but no discrete mass. The specimen was inked and specimen x-ray showed the marking clip contained within the specimen but somewhat close to the superior lateral edge. There was a little bit more firm tissue  along the superior lateral margin which I excised with cautery and sent as separate specimen. Hemostasis was obtained with cautery. The soft tissue was infiltrated with Marcaine. Breast dissecting this tissue was closed with interrupted 3-0 Vicryl after marking the lumpectomy cavity with a single clip. Skin was closed with septic and 5-0 Monocryl and Liquiban. Sponge needle and instrument counts were correct.    Findings: As above  Estimated Blood Loss:  Minimal         Drains: None  Blood Given: none          Specimens: #1 left breast tissue  #2 further superior lateral tissue        Complications:  * No complications entered in OR log *         Disposition: PACU - hemodynamically stable.         Condition: stable

## 2016-07-06 NOTE — Anesthesia Postprocedure Evaluation (Signed)
Anesthesia Post Note  Patient: Emily Fisher  Procedure(s) Performed: Procedure(s) (LRB): LEFT BREAST LUMPECTOMY WITH RADIOACTIVE SEED LOCALIZATION (Left)  Patient location during evaluation: PACU Anesthesia Type: General Level of consciousness: awake and alert Pain management: pain level controlled Vital Signs Assessment: post-procedure vital signs reviewed and stable Respiratory status: spontaneous breathing, nonlabored ventilation and respiratory function stable Cardiovascular status: blood pressure returned to baseline and stable Postop Assessment: no signs of nausea or vomiting Anesthetic complications: no    Last Vitals:  Vitals:   07/06/16 1130 07/06/16 1143  BP:    Pulse: 78 81  Resp: 11 20  Temp:  36.9 C    Last Pain:  Vitals:   07/06/16 1143  TempSrc: Oral  PainSc: 1                  Bitania Shankland,W. EDMOND

## 2016-07-06 NOTE — Interval H&P Note (Signed)
History and Physical Interval Note:  07/06/2016 9:28 AM  Emily HammersmithElizabeth D Hakanson  has presented today for surgery, with the diagnosis of ABNORMAL MAMMOGRAM LEFT BREAST  The various methods of treatment have been discussed with the patient and family. After consideration of risks, benefits and other options for treatment, the patient has consented to  Procedure(s): LEFT BREAST LUMPECTOMY WITH RADIOACTIVE SEED LOCALIZATION (Left) as a surgical intervention .  The patient's history has been reviewed, patient examined, no change in status, stable for surgery.  I have reviewed the patient's chart and labs.  Questions were answered to the patient's satisfaction.     [name]

## 2016-07-09 ENCOUNTER — Encounter (HOSPITAL_BASED_OUTPATIENT_CLINIC_OR_DEPARTMENT_OTHER): Payer: Self-pay | Admitting: General Surgery

## 2017-03-07 ENCOUNTER — Other Ambulatory Visit: Payer: Self-pay | Admitting: Rheumatology

## 2017-03-07 DIAGNOSIS — M948X9 Other specified disorders of cartilage, unspecified sites: Secondary | ICD-10-CM

## 2017-03-18 ENCOUNTER — Other Ambulatory Visit (HOSPITAL_COMMUNITY): Payer: PRIVATE HEALTH INSURANCE

## 2017-03-26 ENCOUNTER — Ambulatory Visit (HOSPITAL_COMMUNITY): Payer: PRIVATE HEALTH INSURANCE | Attending: Cardiovascular Disease

## 2017-03-26 ENCOUNTER — Other Ambulatory Visit: Payer: Self-pay

## 2017-03-26 DIAGNOSIS — M948X9 Other specified disorders of cartilage, unspecified sites: Secondary | ICD-10-CM | POA: Diagnosis not present

## 2017-03-26 DIAGNOSIS — Z8249 Family history of ischemic heart disease and other diseases of the circulatory system: Secondary | ICD-10-CM | POA: Insufficient documentation

## 2017-03-26 DIAGNOSIS — G4733 Obstructive sleep apnea (adult) (pediatric): Secondary | ICD-10-CM | POA: Diagnosis not present

## 2017-03-26 DIAGNOSIS — G43909 Migraine, unspecified, not intractable, without status migrainosus: Secondary | ICD-10-CM | POA: Diagnosis not present

## 2017-12-09 ENCOUNTER — Other Ambulatory Visit: Payer: Self-pay | Admitting: Obstetrics and Gynecology

## 2017-12-09 DIAGNOSIS — Z1231 Encounter for screening mammogram for malignant neoplasm of breast: Secondary | ICD-10-CM

## 2017-12-10 ENCOUNTER — Other Ambulatory Visit: Payer: Self-pay | Admitting: Obstetrics and Gynecology

## 2017-12-10 ENCOUNTER — Ambulatory Visit
Admission: RE | Admit: 2017-12-10 | Discharge: 2017-12-10 | Disposition: A | Discharge status: Home / Self Care | Payer: PRIVATE HEALTH INSURANCE | Source: Ambulatory Visit | Attending: Obstetrics and Gynecology | Admitting: Obstetrics and Gynecology

## 2017-12-10 DIAGNOSIS — Z1231 Encounter for screening mammogram for malignant neoplasm of breast: Secondary | ICD-10-CM

## 2017-12-10 DIAGNOSIS — R928 Other abnormal and inconclusive findings on diagnostic imaging of breast: Secondary | ICD-10-CM

## 2018-01-09 ENCOUNTER — Ambulatory Visit: Payer: PRIVATE HEALTH INSURANCE | Admitting: Sports Medicine

## 2018-05-08 ENCOUNTER — Ambulatory Visit
Admission: RE | Admit: 2018-05-08 | Discharge: 2018-05-08 | Disposition: A | Discharge status: Home / Self Care | Payer: Self-pay | Source: Ambulatory Visit | Attending: Radiology | Admitting: Radiology

## 2018-05-08 ENCOUNTER — Other Ambulatory Visit: Payer: Self-pay | Admitting: Radiology

## 2018-05-08 DIAGNOSIS — Z1239 Encounter for other screening for malignant neoplasm of breast: Secondary | ICD-10-CM

## 2018-05-08 MED ORDER — GADOBUTROL 1 MMOL/ML IV SOLN
6.0000 mL | Freq: Once | INTRAVENOUS | Status: AC | PRN
  Administered 2018-05-08: 6 mL via INTRAVENOUS

## 2018-12-19 ENCOUNTER — Other Ambulatory Visit: Payer: Self-pay | Admitting: Obstetrics and Gynecology

## 2018-12-19 ENCOUNTER — Ambulatory Visit
Admission: RE | Admit: 2018-12-19 | Discharge: 2018-12-19 | Disposition: A | Discharge status: Home / Self Care | Payer: PRIVATE HEALTH INSURANCE | Source: Ambulatory Visit | Attending: Obstetrics and Gynecology | Admitting: Obstetrics and Gynecology

## 2018-12-19 DIAGNOSIS — R928 Other abnormal and inconclusive findings on diagnostic imaging of breast: Secondary | ICD-10-CM

## 2018-12-19 DIAGNOSIS — Z1231 Encounter for screening mammogram for malignant neoplasm of breast: Secondary | ICD-10-CM

## 2019-04-17 ENCOUNTER — Other Ambulatory Visit: Payer: Self-pay | Admitting: Obstetrics and Gynecology

## 2019-04-17 DIAGNOSIS — Z803 Family history of malignant neoplasm of breast: Secondary | ICD-10-CM

## 2019-04-24 ENCOUNTER — Other Ambulatory Visit: Payer: Self-pay

## 2019-04-24 ENCOUNTER — Ambulatory Visit
Admission: RE | Admit: 2019-04-24 | Discharge: 2019-04-24 | Disposition: A | Discharge status: Home / Self Care | Payer: PRIVATE HEALTH INSURANCE | Source: Ambulatory Visit | Attending: Obstetrics and Gynecology | Admitting: Obstetrics and Gynecology

## 2019-04-24 DIAGNOSIS — Z803 Family history of malignant neoplasm of breast: Secondary | ICD-10-CM

## 2019-04-24 MED ORDER — GADOBUTROL 1 MMOL/ML IV SOLN
5.0000 mL | Freq: Once | INTRAVENOUS | Status: AC | PRN
  Administered 2019-04-24: 5 mL via INTRAVENOUS

## 2019-05-07 DIAGNOSIS — Z79899 Other long term (current) drug therapy: Secondary | ICD-10-CM | POA: Diagnosis not present

## 2019-06-08 DIAGNOSIS — Z79899 Other long term (current) drug therapy: Secondary | ICD-10-CM | POA: Diagnosis not present

## 2019-06-08 DIAGNOSIS — M941 Relapsing polychondritis: Secondary | ICD-10-CM | POA: Diagnosis not present

## 2019-06-08 DIAGNOSIS — H2513 Age-related nuclear cataract, bilateral: Secondary | ICD-10-CM | POA: Diagnosis not present

## 2019-06-08 DIAGNOSIS — H04129 Dry eye syndrome of unspecified lacrimal gland: Secondary | ICD-10-CM | POA: Diagnosis not present

## 2019-06-08 DIAGNOSIS — H15003 Unspecified scleritis, bilateral: Secondary | ICD-10-CM | POA: Diagnosis not present

## 2019-06-08 DIAGNOSIS — H43811 Vitreous degeneration, right eye: Secondary | ICD-10-CM | POA: Diagnosis not present

## 2019-07-10 ENCOUNTER — Encounter: Payer: Self-pay | Admitting: Podiatry

## 2019-07-10 ENCOUNTER — Ambulatory Visit: Payer: BC Managed Care – PPO | Admitting: Podiatry

## 2019-07-10 ENCOUNTER — Other Ambulatory Visit: Payer: Self-pay

## 2019-07-10 DIAGNOSIS — L600 Ingrowing nail: Secondary | ICD-10-CM | POA: Diagnosis not present

## 2019-07-10 MED ORDER — NEOMYCIN-POLYMYXIN-HC 3.5-10000-1 OT SOLN: OTIC | 1 refills | Status: DC

## 2019-07-10 NOTE — Patient Instructions (Signed)

## 2019-07-10 NOTE — Progress Notes (Signed)
   Subjective:    Patient ID: Emily Fisher, female    DOB: 1966/03/08, 53 y.o.   MRN: [id number]  HPI    Review of Systems  All other systems reviewed and are negative.      Objective:   Physical Exam        Assessment & Plan:

## 2019-07-15 DIAGNOSIS — R7301 Impaired fasting glucose: Secondary | ICD-10-CM | POA: Diagnosis not present

## 2019-07-15 DIAGNOSIS — Z0000 Encounter for general adult medical examination without abnormal findings: Secondary | ICD-10-CM | POA: Diagnosis not present

## 2019-07-15 DIAGNOSIS — M859 Disorder of bone density and structure, unspecified: Secondary | ICD-10-CM | POA: Diagnosis not present

## 2019-07-15 NOTE — Progress Notes (Signed)
Subjective:   Patient ID: Emily Fisher, female   DOB: 53 y.o.   MRN: [id number]   HPI Patient presents stating that she has a painful ingrown toenail of the left big toe and states that she is tried to trim it and soak herself without relief of symptoms.  Has been present for a long time   Review of Systems  All other systems reviewed and are negative.       Objective:  Physical Exam Vitals signs and nursing note reviewed.  Constitutional:      Appearance: She is well-developed.  Pulmonary:     Effort: Pulmonary effort is normal.  Musculoskeletal: Normal range of motion.  Skin:    General: Skin is warm.  Neurological:     Mental Status: She is alert.     Neurovascular status intact muscle strength found to be adequate range of motion within normal limits with patient found to have ingrown left hallux medial border that incurvated painful when pressed and make shoe gear difficult.  There is mild redness no active drainage noted     Assessment:  Ingrown toenail deformity left hallux medial border with pain     Plan:  H&P condition reviewed and recommended long-term permanent procedure explained procedure to patient.  Patient wants surgery and today I explained the procedure and what would be required and I went ahead and infiltrated the left hallux 60 mg like Marcaine mixture remove border with sterile instrumentation after sterile prep and exposed matrix applied phenol 3 applications 30 seconds followed by alcohol lavage and sterile dressing.  Gave instructions on soaks and reappoint to recheck and encouraged to call with questions and wrote prescription for drops

## 2019-08-03 ENCOUNTER — Ambulatory Visit: Payer: PRIVATE HEALTH INSURANCE | Admitting: Podiatry

## 2019-08-03 DIAGNOSIS — M8580 Other specified disorders of bone density and structure, unspecified site: Secondary | ICD-10-CM | POA: Diagnosis not present

## 2019-08-03 DIAGNOSIS — Z1331 Encounter for screening for depression: Secondary | ICD-10-CM | POA: Diagnosis not present

## 2019-08-03 DIAGNOSIS — R82998 Other abnormal findings in urine: Secondary | ICD-10-CM | POA: Diagnosis not present

## 2019-08-03 DIAGNOSIS — D849 Immunodeficiency, unspecified: Secondary | ICD-10-CM | POA: Diagnosis not present

## 2019-08-03 DIAGNOSIS — M941 Relapsing polychondritis: Secondary | ICD-10-CM | POA: Diagnosis not present

## 2019-08-03 DIAGNOSIS — Z0000 Encounter for general adult medical examination without abnormal findings: Secondary | ICD-10-CM | POA: Diagnosis not present

## 2019-08-03 DIAGNOSIS — R7301 Impaired fasting glucose: Secondary | ICD-10-CM | POA: Diagnosis not present

## 2019-09-14 DIAGNOSIS — M941 Relapsing polychondritis: Secondary | ICD-10-CM | POA: Diagnosis not present

## 2019-09-14 DIAGNOSIS — H15003 Unspecified scleritis, bilateral: Secondary | ICD-10-CM | POA: Diagnosis not present

## 2019-09-14 DIAGNOSIS — H2513 Age-related nuclear cataract, bilateral: Secondary | ICD-10-CM | POA: Diagnosis not present

## 2019-09-14 DIAGNOSIS — H04129 Dry eye syndrome of unspecified lacrimal gland: Secondary | ICD-10-CM | POA: Diagnosis not present

## 2019-09-15 ENCOUNTER — Other Ambulatory Visit: Payer: Self-pay

## 2019-09-15 DIAGNOSIS — G43109 Migraine with aura, not intractable, without status migrainosus: Secondary | ICD-10-CM | POA: Diagnosis not present

## 2019-09-15 DIAGNOSIS — Z20822 Contact with and (suspected) exposure to covid-19: Secondary | ICD-10-CM

## 2019-09-15 DIAGNOSIS — G43709 Chronic migraine without aura, not intractable, without status migrainosus: Secondary | ICD-10-CM | POA: Diagnosis not present

## 2019-09-15 DIAGNOSIS — Z23 Encounter for immunization: Secondary | ICD-10-CM | POA: Diagnosis not present

## 2019-09-16 DIAGNOSIS — M180 Bilateral primary osteoarthritis of first carpometacarpal joints: Secondary | ICD-10-CM | POA: Diagnosis not present

## 2019-09-16 DIAGNOSIS — Z79899 Other long term (current) drug therapy: Secondary | ICD-10-CM | POA: Diagnosis not present

## 2019-09-16 DIAGNOSIS — M79643 Pain in unspecified hand: Secondary | ICD-10-CM | POA: Diagnosis not present

## 2019-09-16 DIAGNOSIS — M941 Relapsing polychondritis: Secondary | ICD-10-CM | POA: Diagnosis not present

## 2019-09-16 LAB — NOVEL CORONAVIRUS, NAA: SARS-CoV-2, NAA: NOT DETECTED

## 2019-10-08 DIAGNOSIS — G4733 Obstructive sleep apnea (adult) (pediatric): Secondary | ICD-10-CM | POA: Diagnosis not present

## 2019-10-18 DIAGNOSIS — Z20828 Contact with and (suspected) exposure to other viral communicable diseases: Secondary | ICD-10-CM | POA: Diagnosis not present

## 2020-01-01 DIAGNOSIS — N952 Postmenopausal atrophic vaginitis: Secondary | ICD-10-CM | POA: Diagnosis not present

## 2020-01-01 DIAGNOSIS — M8588 Other specified disorders of bone density and structure, other site: Secondary | ICD-10-CM | POA: Diagnosis not present

## 2020-01-01 DIAGNOSIS — Z01419 Encounter for gynecological examination (general) (routine) without abnormal findings: Secondary | ICD-10-CM | POA: Diagnosis not present

## 2020-01-01 DIAGNOSIS — Z13820 Encounter for screening for osteoporosis: Secondary | ICD-10-CM | POA: Diagnosis not present

## 2020-01-01 DIAGNOSIS — Z6823 Body mass index (BMI) 23.0-23.9, adult: Secondary | ICD-10-CM | POA: Diagnosis not present

## 2020-01-01 DIAGNOSIS — N958 Other specified menopausal and perimenopausal disorders: Secondary | ICD-10-CM | POA: Diagnosis not present

## 2020-01-06 DIAGNOSIS — G4733 Obstructive sleep apnea (adult) (pediatric): Secondary | ICD-10-CM | POA: Diagnosis not present

## 2020-01-19 ENCOUNTER — Other Ambulatory Visit: Payer: Self-pay | Admitting: Obstetrics and Gynecology

## 2020-01-19 ENCOUNTER — Ambulatory Visit
Admission: RE | Admit: 2020-01-19 | Discharge: 2020-01-19 | Disposition: A | Discharge status: Home / Self Care | Payer: PRIVATE HEALTH INSURANCE | Source: Ambulatory Visit

## 2020-01-19 ENCOUNTER — Ambulatory Visit
Admission: RE | Admit: 2020-01-19 | Discharge: 2020-01-19 | Disposition: A | Discharge status: Home / Self Care | Payer: PRIVATE HEALTH INSURANCE | Source: Ambulatory Visit | Attending: Obstetrics and Gynecology | Admitting: Obstetrics and Gynecology

## 2020-01-19 ENCOUNTER — Other Ambulatory Visit: Payer: Self-pay

## 2020-01-19 DIAGNOSIS — R928 Other abnormal and inconclusive findings on diagnostic imaging of breast: Secondary | ICD-10-CM

## 2020-01-19 DIAGNOSIS — Z1231 Encounter for screening mammogram for malignant neoplasm of breast: Secondary | ICD-10-CM

## 2020-01-19 DIAGNOSIS — R922 Inconclusive mammogram: Secondary | ICD-10-CM | POA: Diagnosis not present

## 2020-01-19 DIAGNOSIS — N6489 Other specified disorders of breast: Secondary | ICD-10-CM | POA: Diagnosis not present

## 2020-04-06 DIAGNOSIS — G4733 Obstructive sleep apnea (adult) (pediatric): Secondary | ICD-10-CM | POA: Diagnosis not present

## 2020-05-12 DIAGNOSIS — M79643 Pain in unspecified hand: Secondary | ICD-10-CM | POA: Diagnosis not present

## 2020-05-12 DIAGNOSIS — Z79899 Other long term (current) drug therapy: Secondary | ICD-10-CM | POA: Diagnosis not present

## 2020-05-12 DIAGNOSIS — M180 Bilateral primary osteoarthritis of first carpometacarpal joints: Secondary | ICD-10-CM | POA: Diagnosis not present

## 2020-05-12 DIAGNOSIS — M941 Relapsing polychondritis: Secondary | ICD-10-CM | POA: Diagnosis not present

## 2020-06-16 DIAGNOSIS — M941 Relapsing polychondritis: Secondary | ICD-10-CM | POA: Diagnosis not present

## 2020-06-16 DIAGNOSIS — H04129 Dry eye syndrome of unspecified lacrimal gland: Secondary | ICD-10-CM | POA: Diagnosis not present

## 2020-06-16 DIAGNOSIS — H2513 Age-related nuclear cataract, bilateral: Secondary | ICD-10-CM | POA: Diagnosis not present

## 2020-06-16 DIAGNOSIS — H15003 Unspecified scleritis, bilateral: Secondary | ICD-10-CM | POA: Diagnosis not present

## 2020-06-24 DIAGNOSIS — Z20828 Contact with and (suspected) exposure to other viral communicable diseases: Secondary | ICD-10-CM | POA: Diagnosis not present

## 2020-06-24 DIAGNOSIS — R0981 Nasal congestion: Secondary | ICD-10-CM | POA: Diagnosis not present

## 2020-07-05 DIAGNOSIS — G4733 Obstructive sleep apnea (adult) (pediatric): Secondary | ICD-10-CM | POA: Diagnosis not present

## 2020-08-08 DIAGNOSIS — Z79899 Other long term (current) drug therapy: Secondary | ICD-10-CM | POA: Diagnosis not present

## 2020-08-08 DIAGNOSIS — I83813 Varicose veins of bilateral lower extremities with pain: Secondary | ICD-10-CM | POA: Diagnosis not present

## 2020-08-08 DIAGNOSIS — R7301 Impaired fasting glucose: Secondary | ICD-10-CM | POA: Diagnosis not present

## 2020-08-08 DIAGNOSIS — Z0000 Encounter for general adult medical examination without abnormal findings: Secondary | ICD-10-CM | POA: Diagnosis not present

## 2020-08-08 DIAGNOSIS — M859 Disorder of bone density and structure, unspecified: Secondary | ICD-10-CM | POA: Diagnosis not present

## 2020-08-09 DIAGNOSIS — Z1331 Encounter for screening for depression: Secondary | ICD-10-CM | POA: Diagnosis not present

## 2020-08-09 DIAGNOSIS — M1812 Unilateral primary osteoarthritis of first carpometacarpal joint, left hand: Secondary | ICD-10-CM | POA: Diagnosis not present

## 2020-08-09 DIAGNOSIS — M25532 Pain in left wrist: Secondary | ICD-10-CM | POA: Diagnosis not present

## 2020-08-09 DIAGNOSIS — M1811 Unilateral primary osteoarthritis of first carpometacarpal joint, right hand: Secondary | ICD-10-CM | POA: Diagnosis not present

## 2020-08-09 DIAGNOSIS — M180 Bilateral primary osteoarthritis of first carpometacarpal joints: Secondary | ICD-10-CM | POA: Diagnosis not present

## 2020-08-09 DIAGNOSIS — Z1389 Encounter for screening for other disorder: Secondary | ICD-10-CM | POA: Diagnosis not present

## 2020-08-09 DIAGNOSIS — M25531 Pain in right wrist: Secondary | ICD-10-CM | POA: Diagnosis not present

## 2020-08-09 DIAGNOSIS — R7301 Impaired fasting glucose: Secondary | ICD-10-CM | POA: Diagnosis not present

## 2020-08-09 DIAGNOSIS — R82998 Other abnormal findings in urine: Secondary | ICD-10-CM | POA: Diagnosis not present

## 2020-08-09 DIAGNOSIS — Z0000 Encounter for general adult medical examination without abnormal findings: Secondary | ICD-10-CM | POA: Diagnosis not present

## 2020-08-31 DIAGNOSIS — I83813 Varicose veins of bilateral lower extremities with pain: Secondary | ICD-10-CM | POA: Diagnosis not present

## 2020-10-04 DIAGNOSIS — G4733 Obstructive sleep apnea (adult) (pediatric): Secondary | ICD-10-CM | POA: Diagnosis not present

## 2020-10-06 DIAGNOSIS — Z79899 Other long term (current) drug therapy: Secondary | ICD-10-CM | POA: Diagnosis not present

## 2020-10-06 DIAGNOSIS — M941 Relapsing polychondritis: Secondary | ICD-10-CM | POA: Diagnosis not present

## 2020-10-06 DIAGNOSIS — M79643 Pain in unspecified hand: Secondary | ICD-10-CM | POA: Diagnosis not present

## 2020-10-06 DIAGNOSIS — M180 Bilateral primary osteoarthritis of first carpometacarpal joints: Secondary | ICD-10-CM | POA: Diagnosis not present

## 2020-10-18 DIAGNOSIS — Z1212 Encounter for screening for malignant neoplasm of rectum: Secondary | ICD-10-CM | POA: Diagnosis not present

## 2020-11-22 DIAGNOSIS — Z23 Encounter for immunization: Secondary | ICD-10-CM | POA: Diagnosis not present

## 2020-11-22 DIAGNOSIS — Z7184 Encounter for health counseling related to travel: Secondary | ICD-10-CM | POA: Diagnosis not present

## 2021-01-02 DIAGNOSIS — G4733 Obstructive sleep apnea (adult) (pediatric): Secondary | ICD-10-CM | POA: Diagnosis not present

## 2021-01-23 DIAGNOSIS — G43709 Chronic migraine without aura, not intractable, without status migrainosus: Secondary | ICD-10-CM | POA: Diagnosis not present

## 2021-01-23 DIAGNOSIS — G43109 Migraine with aura, not intractable, without status migrainosus: Secondary | ICD-10-CM | POA: Diagnosis not present

## 2021-02-03 ENCOUNTER — Ambulatory Visit
Admission: RE | Admit: 2021-02-03 | Discharge: 2021-02-03 | Disposition: A | Discharge status: Home / Self Care | Payer: BC Managed Care – PPO | Source: Ambulatory Visit | Attending: Obstetrics and Gynecology | Admitting: Obstetrics and Gynecology

## 2021-02-03 ENCOUNTER — Other Ambulatory Visit: Payer: Self-pay

## 2021-02-03 ENCOUNTER — Other Ambulatory Visit: Payer: Self-pay | Admitting: Obstetrics and Gynecology

## 2021-02-03 DIAGNOSIS — R922 Inconclusive mammogram: Secondary | ICD-10-CM | POA: Diagnosis not present

## 2021-02-03 DIAGNOSIS — Z1231 Encounter for screening mammogram for malignant neoplasm of breast: Secondary | ICD-10-CM | POA: Diagnosis not present

## 2021-02-03 DIAGNOSIS — N6310 Unspecified lump in the right breast, unspecified quadrant: Secondary | ICD-10-CM

## 2021-02-20 DIAGNOSIS — Z01419 Encounter for gynecological examination (general) (routine) without abnormal findings: Secondary | ICD-10-CM | POA: Diagnosis not present

## 2021-02-20 DIAGNOSIS — N952 Postmenopausal atrophic vaginitis: Secondary | ICD-10-CM | POA: Diagnosis not present

## 2021-02-20 DIAGNOSIS — Z6822 Body mass index (BMI) 22.0-22.9, adult: Secondary | ICD-10-CM | POA: Diagnosis not present

## 2021-02-21 DIAGNOSIS — M180 Bilateral primary osteoarthritis of first carpometacarpal joints: Secondary | ICD-10-CM | POA: Diagnosis not present

## 2021-02-21 DIAGNOSIS — I83812 Varicose veins of left lower extremities with pain: Secondary | ICD-10-CM | POA: Diagnosis not present

## 2021-02-21 DIAGNOSIS — I8312 Varicose veins of left lower extremity with inflammation: Secondary | ICD-10-CM | POA: Diagnosis not present

## 2021-02-23 DIAGNOSIS — I8312 Varicose veins of left lower extremity with inflammation: Secondary | ICD-10-CM | POA: Diagnosis not present

## 2021-03-17 DIAGNOSIS — G40219 Localization-related (focal) (partial) symptomatic epilepsy and epileptic syndromes with complex partial seizures, intractable, without status epilepticus: Secondary | ICD-10-CM | POA: Diagnosis not present

## 2021-04-04 DIAGNOSIS — G4733 Obstructive sleep apnea (adult) (pediatric): Secondary | ICD-10-CM | POA: Diagnosis not present

## 2021-05-22 DIAGNOSIS — G40219 Localization-related (focal) (partial) symptomatic epilepsy and epileptic syndromes with complex partial seizures, intractable, without status epilepticus: Secondary | ICD-10-CM | POA: Diagnosis not present

## 2021-05-22 DIAGNOSIS — I8312 Varicose veins of left lower extremity with inflammation: Secondary | ICD-10-CM | POA: Diagnosis not present

## 2021-05-23 DIAGNOSIS — G40219 Localization-related (focal) (partial) symptomatic epilepsy and epileptic syndromes with complex partial seizures, intractable, without status epilepticus: Secondary | ICD-10-CM | POA: Diagnosis not present

## 2021-05-24 DIAGNOSIS — M941 Relapsing polychondritis: Secondary | ICD-10-CM | POA: Diagnosis not present

## 2021-05-24 DIAGNOSIS — Z79899 Other long term (current) drug therapy: Secondary | ICD-10-CM | POA: Diagnosis not present

## 2021-05-24 DIAGNOSIS — M79643 Pain in unspecified hand: Secondary | ICD-10-CM | POA: Diagnosis not present

## 2021-05-24 DIAGNOSIS — M180 Bilateral primary osteoarthritis of first carpometacarpal joints: Secondary | ICD-10-CM | POA: Diagnosis not present

## 2021-05-24 DIAGNOSIS — G40219 Localization-related (focal) (partial) symptomatic epilepsy and epileptic syndromes with complex partial seizures, intractable, without status epilepticus: Secondary | ICD-10-CM | POA: Diagnosis not present

## 2021-06-26 ENCOUNTER — Telehealth: Payer: Self-pay | Admitting: Internal Medicine

## 2021-06-27 DIAGNOSIS — G40219 Localization-related (focal) (partial) symptomatic epilepsy and epileptic syndromes with complex partial seizures, intractable, without status epilepticus: Secondary | ICD-10-CM | POA: Diagnosis not present

## 2021-06-27 DIAGNOSIS — G43709 Chronic migraine without aura, not intractable, without status migrainosus: Secondary | ICD-10-CM | POA: Diagnosis not present

## 2021-07-05 DIAGNOSIS — G4733 Obstructive sleep apnea (adult) (pediatric): Secondary | ICD-10-CM | POA: Diagnosis not present

## 2021-07-11 DIAGNOSIS — Z0110 Encounter for examination of ears and hearing without abnormal findings: Secondary | ICD-10-CM | POA: Diagnosis not present

## 2021-07-11 DIAGNOSIS — H9192 Unspecified hearing loss, left ear: Secondary | ICD-10-CM | POA: Diagnosis not present

## 2021-07-18 DIAGNOSIS — B9689 Other specified bacterial agents as the cause of diseases classified elsewhere: Secondary | ICD-10-CM | POA: Diagnosis not present

## 2021-07-18 DIAGNOSIS — R059 Cough, unspecified: Secondary | ICD-10-CM | POA: Diagnosis not present

## 2021-07-18 DIAGNOSIS — J019 Acute sinusitis, unspecified: Secondary | ICD-10-CM | POA: Diagnosis not present

## 2021-07-18 DIAGNOSIS — Z20822 Contact with and (suspected) exposure to covid-19: Secondary | ICD-10-CM | POA: Diagnosis not present

## 2021-07-24 NOTE — Telephone Encounter (Signed)
error 

## 2021-08-04 DIAGNOSIS — G4733 Obstructive sleep apnea (adult) (pediatric): Secondary | ICD-10-CM | POA: Diagnosis not present

## 2021-08-07 DIAGNOSIS — I8312 Varicose veins of left lower extremity with inflammation: Secondary | ICD-10-CM | POA: Diagnosis not present

## 2021-08-26 DIAGNOSIS — J019 Acute sinusitis, unspecified: Secondary | ICD-10-CM | POA: Diagnosis not present

## 2021-08-26 DIAGNOSIS — B9689 Other specified bacterial agents as the cause of diseases classified elsewhere: Secondary | ICD-10-CM | POA: Diagnosis not present

## 2021-08-26 DIAGNOSIS — I10 Essential (primary) hypertension: Secondary | ICD-10-CM | POA: Diagnosis not present

## 2021-08-26 DIAGNOSIS — Z20822 Contact with and (suspected) exposure to covid-19: Secondary | ICD-10-CM | POA: Diagnosis not present

## 2021-09-01 DIAGNOSIS — H10023 Other mucopurulent conjunctivitis, bilateral: Secondary | ICD-10-CM | POA: Diagnosis not present

## 2021-09-04 DIAGNOSIS — G4733 Obstructive sleep apnea (adult) (pediatric): Secondary | ICD-10-CM | POA: Diagnosis not present

## 2021-09-18 DIAGNOSIS — G4733 Obstructive sleep apnea (adult) (pediatric): Secondary | ICD-10-CM | POA: Diagnosis not present

## 2021-10-02 DIAGNOSIS — R6884 Jaw pain: Secondary | ICD-10-CM | POA: Diagnosis not present

## 2021-10-02 DIAGNOSIS — R7989 Other specified abnormal findings of blood chemistry: Secondary | ICD-10-CM | POA: Diagnosis not present

## 2021-10-02 DIAGNOSIS — R7301 Impaired fasting glucose: Secondary | ICD-10-CM | POA: Diagnosis not present

## 2021-10-02 DIAGNOSIS — G43709 Chronic migraine without aura, not intractable, without status migrainosus: Secondary | ICD-10-CM | POA: Diagnosis not present

## 2021-10-02 DIAGNOSIS — M859 Disorder of bone density and structure, unspecified: Secondary | ICD-10-CM | POA: Diagnosis not present

## 2021-10-03 DIAGNOSIS — H9192 Unspecified hearing loss, left ear: Secondary | ICD-10-CM | POA: Diagnosis not present

## 2021-10-03 DIAGNOSIS — Z1331 Encounter for screening for depression: Secondary | ICD-10-CM | POA: Diagnosis not present

## 2021-10-03 DIAGNOSIS — Z0000 Encounter for general adult medical examination without abnormal findings: Secondary | ICD-10-CM | POA: Diagnosis not present

## 2021-10-03 DIAGNOSIS — Z1339 Encounter for screening examination for other mental health and behavioral disorders: Secondary | ICD-10-CM | POA: Diagnosis not present

## 2021-10-03 DIAGNOSIS — R7301 Impaired fasting glucose: Secondary | ICD-10-CM | POA: Diagnosis not present

## 2021-10-04 DIAGNOSIS — R6884 Jaw pain: Secondary | ICD-10-CM | POA: Diagnosis not present

## 2021-10-04 DIAGNOSIS — G43709 Chronic migraine without aura, not intractable, without status migrainosus: Secondary | ICD-10-CM | POA: Diagnosis not present

## 2021-10-05 DIAGNOSIS — H15003 Unspecified scleritis, bilateral: Secondary | ICD-10-CM | POA: Diagnosis not present

## 2021-10-05 DIAGNOSIS — H04129 Dry eye syndrome of unspecified lacrimal gland: Secondary | ICD-10-CM | POA: Diagnosis not present

## 2021-10-05 DIAGNOSIS — H2513 Age-related nuclear cataract, bilateral: Secondary | ICD-10-CM | POA: Diagnosis not present

## 2021-10-05 DIAGNOSIS — M941 Relapsing polychondritis: Secondary | ICD-10-CM | POA: Diagnosis not present

## 2021-10-05 DIAGNOSIS — G4733 Obstructive sleep apnea (adult) (pediatric): Secondary | ICD-10-CM | POA: Diagnosis not present

## 2021-10-18 DIAGNOSIS — G43709 Chronic migraine without aura, not intractable, without status migrainosus: Secondary | ICD-10-CM | POA: Diagnosis not present

## 2021-10-18 DIAGNOSIS — R6884 Jaw pain: Secondary | ICD-10-CM | POA: Diagnosis not present

## 2021-10-25 DIAGNOSIS — R6884 Jaw pain: Secondary | ICD-10-CM | POA: Diagnosis not present

## 2021-10-25 DIAGNOSIS — G43709 Chronic migraine without aura, not intractable, without status migrainosus: Secondary | ICD-10-CM | POA: Diagnosis not present

## 2021-11-01 DIAGNOSIS — G43709 Chronic migraine without aura, not intractable, without status migrainosus: Secondary | ICD-10-CM | POA: Diagnosis not present

## 2021-11-01 DIAGNOSIS — R6884 Jaw pain: Secondary | ICD-10-CM | POA: Diagnosis not present

## 2021-11-05 DIAGNOSIS — G4733 Obstructive sleep apnea (adult) (pediatric): Secondary | ICD-10-CM | POA: Diagnosis not present

## 2021-11-08 DIAGNOSIS — G43709 Chronic migraine without aura, not intractable, without status migrainosus: Secondary | ICD-10-CM | POA: Diagnosis not present

## 2021-11-08 DIAGNOSIS — R6884 Jaw pain: Secondary | ICD-10-CM | POA: Diagnosis not present

## 2021-11-22 DIAGNOSIS — R6884 Jaw pain: Secondary | ICD-10-CM | POA: Diagnosis not present

## 2021-11-22 DIAGNOSIS — G43709 Chronic migraine without aura, not intractable, without status migrainosus: Secondary | ICD-10-CM | POA: Diagnosis not present

## 2021-12-04 IMAGING — MG MM DIGITAL SCREENING BILAT W/ TOMO AND CAD
8 series · 9 of 24 positions shown · non-contrast
Comparison: Previous exam(s).

CLINICAL DATA: Screening.

EXAM:
DIGITAL SCREENING BILATERAL MAMMOGRAM WITH TOMOSYNTHESIS AND CAD
TECHNIQUE: Bilateral screening digital craniocaudal and mediolateral oblique
mammograms were obtained. Bilateral screening digital breast
tomosynthesis was performed. The images were evaluated with
computer-aided detection.

[R CC synth-2D]
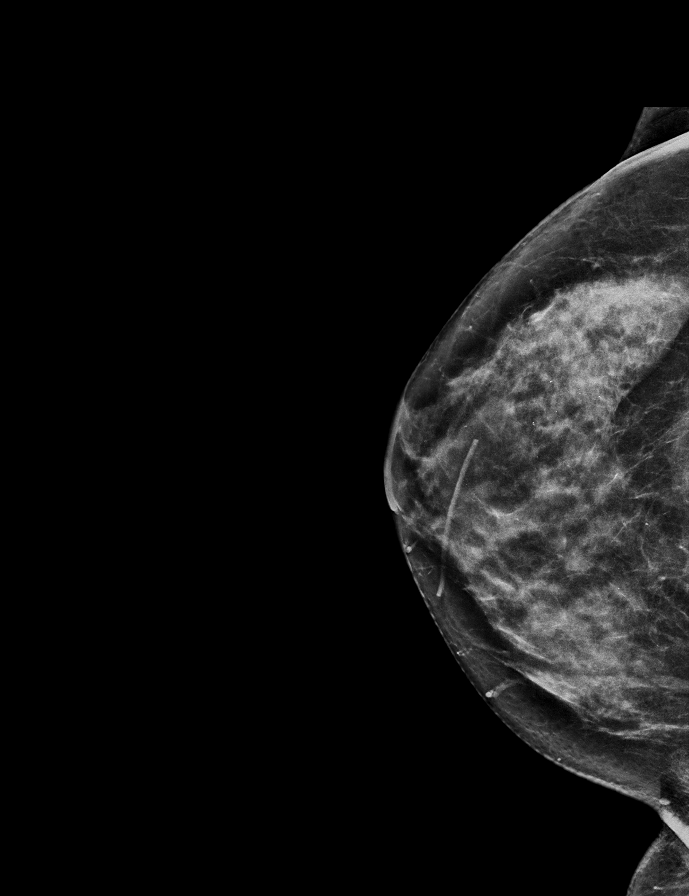

[L MLO synth-2D]
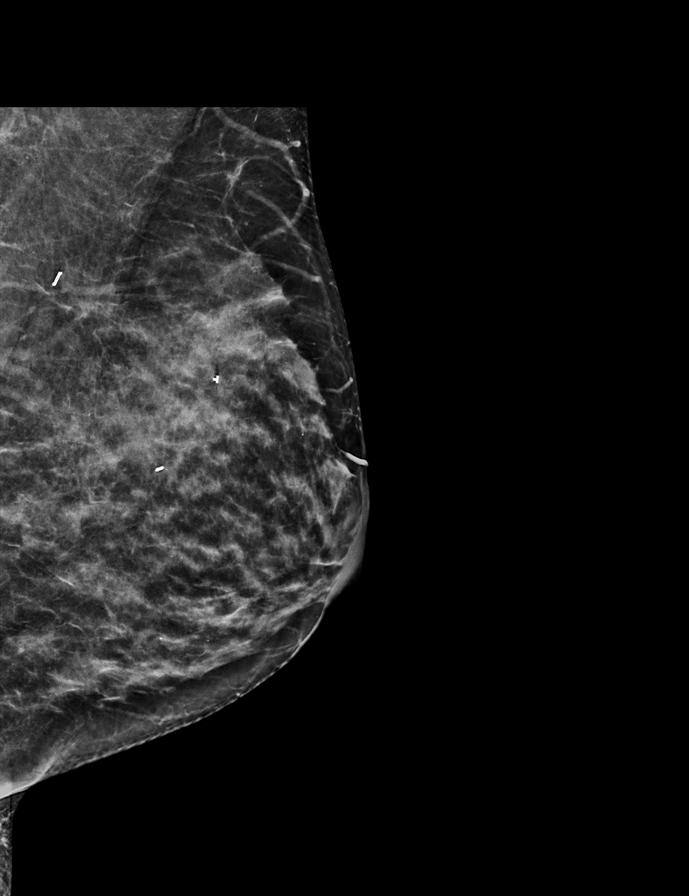

[L CC synth-2D]
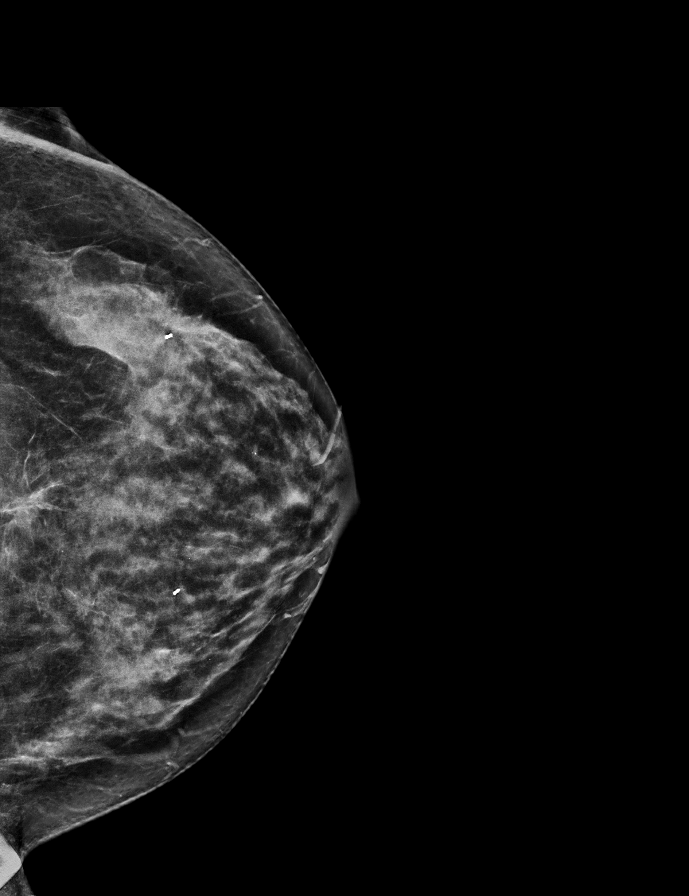

[R MLO synth-2D]
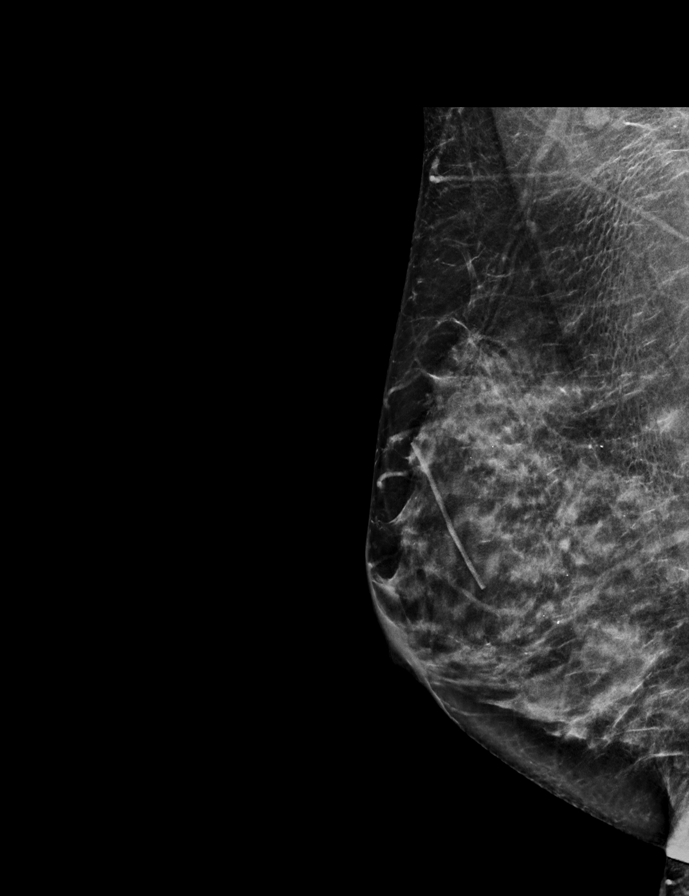

[L CC tomo · 2 of 60 frames shown]
[frame 20/60]
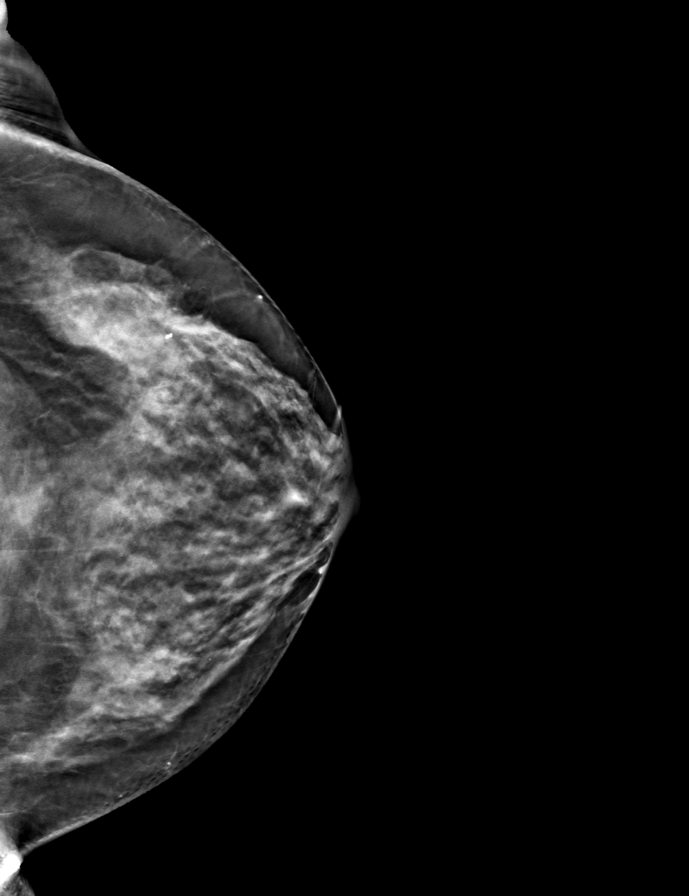
[frame 31/60]
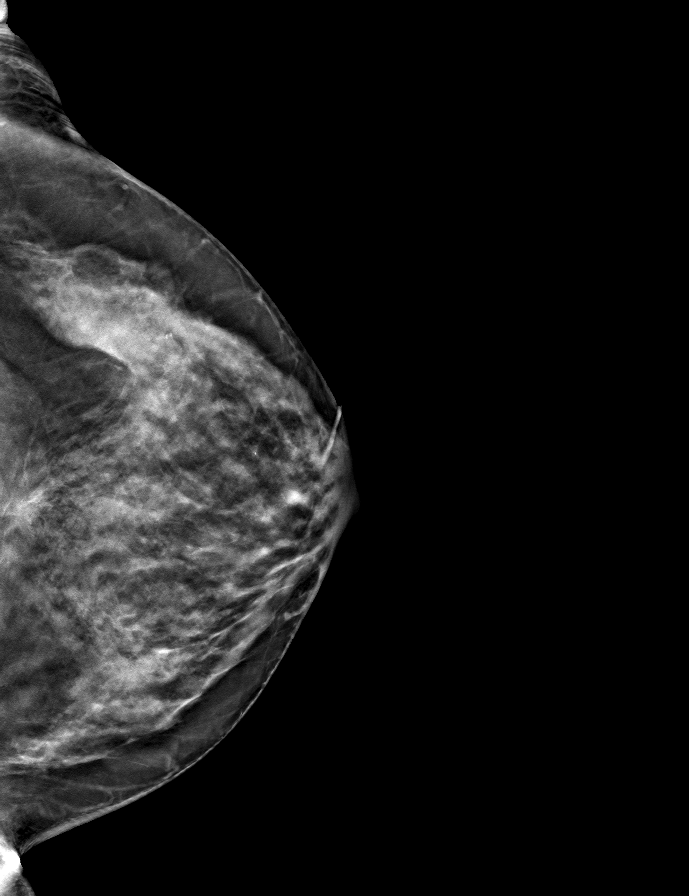

[L MLO tomo · tomo slice 30/59.0]
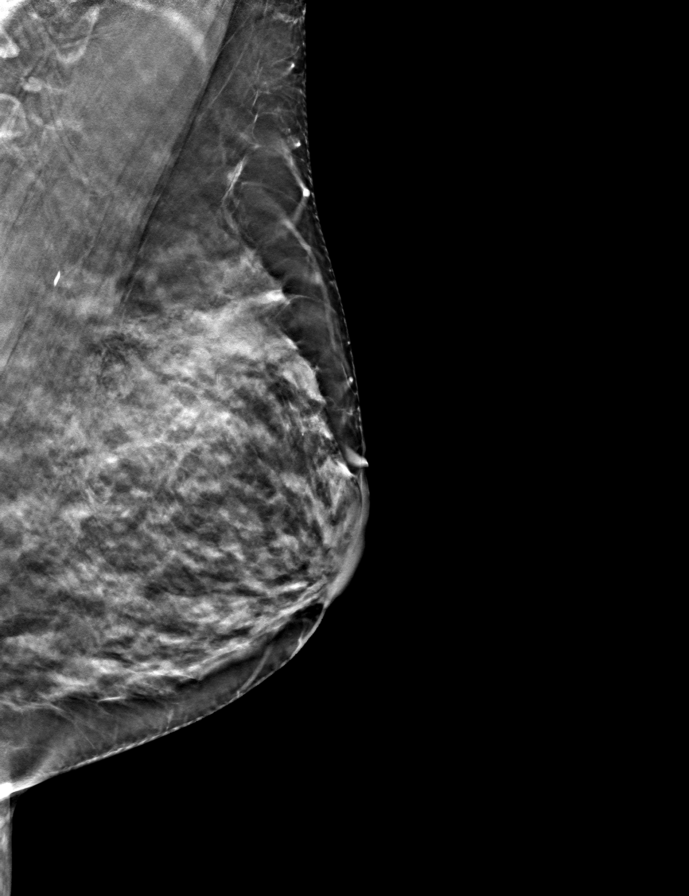

[R MLO tomo · tomo slice 30/59.0]
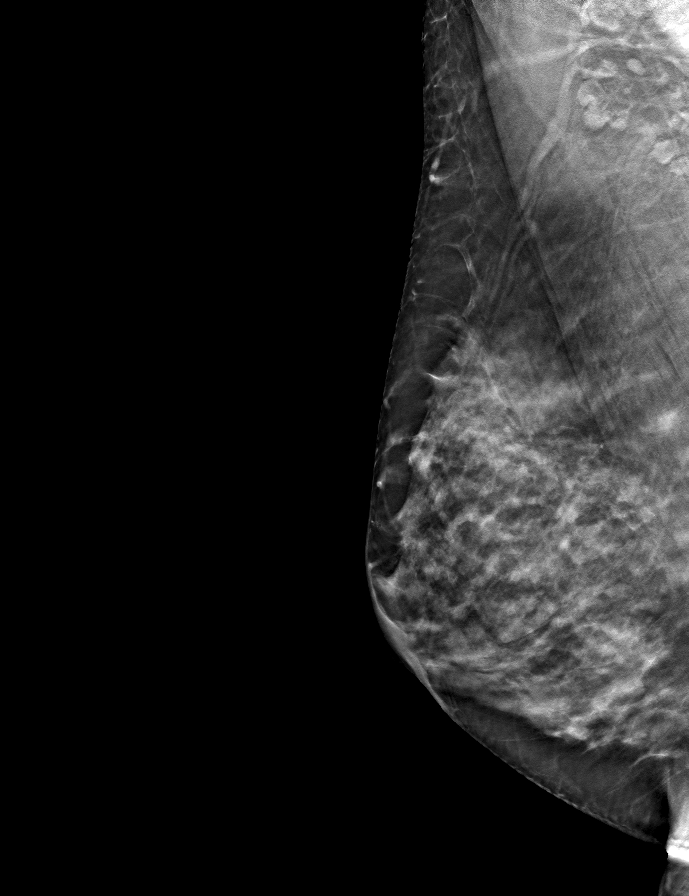

[R CC tomo · tomo slice 31/62.0]
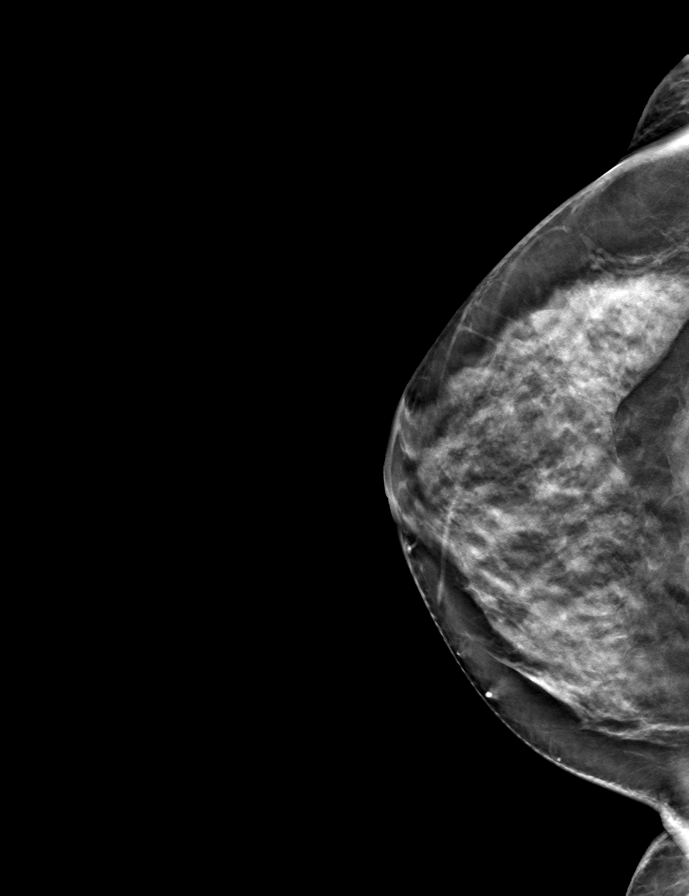

[9 of 24 positions shown; findings below may reference images not displayed]

ACR Breast Density Category c: The breast tissue is heterogeneously
dense, which may obscure small masses.
FINDINGS: In the right breast, a possible asymmetry warrants further
evaluation. In the left breast, no findings suspicious for
malignancy.
IMPRESSION: Further evaluation is suggested for possible asymmetry in the right
breast.

RECOMMENDATION:
Diagnostic mammogram and possibly ultrasound of the right breast.
(Code:06-Y-99A)

The patient will be contacted regarding the findings, and additional
imaging will be scheduled.

BI-RADS CATEGORY  0: Incomplete. Need additional imaging evaluation
and/or prior mammograms for comparison.

## 2021-12-05 DIAGNOSIS — Z79899 Other long term (current) drug therapy: Secondary | ICD-10-CM | POA: Diagnosis not present

## 2021-12-05 DIAGNOSIS — M79643 Pain in unspecified hand: Secondary | ICD-10-CM | POA: Diagnosis not present

## 2021-12-05 DIAGNOSIS — M941 Relapsing polychondritis: Secondary | ICD-10-CM | POA: Diagnosis not present

## 2021-12-05 DIAGNOSIS — M180 Bilateral primary osteoarthritis of first carpometacarpal joints: Secondary | ICD-10-CM | POA: Diagnosis not present

## 2021-12-06 DIAGNOSIS — R6884 Jaw pain: Secondary | ICD-10-CM | POA: Diagnosis not present

## 2021-12-06 DIAGNOSIS — G4733 Obstructive sleep apnea (adult) (pediatric): Secondary | ICD-10-CM | POA: Diagnosis not present

## 2021-12-06 DIAGNOSIS — G43709 Chronic migraine without aura, not intractable, without status migrainosus: Secondary | ICD-10-CM | POA: Diagnosis not present

## 2021-12-26 DIAGNOSIS — G43709 Chronic migraine without aura, not intractable, without status migrainosus: Secondary | ICD-10-CM | POA: Diagnosis not present

## 2021-12-26 DIAGNOSIS — G40219 Localization-related (focal) (partial) symptomatic epilepsy and epileptic syndromes with complex partial seizures, intractable, without status epilepticus: Secondary | ICD-10-CM | POA: Diagnosis not present

## 2021-12-27 DIAGNOSIS — G43709 Chronic migraine without aura, not intractable, without status migrainosus: Secondary | ICD-10-CM | POA: Diagnosis not present

## 2021-12-27 DIAGNOSIS — R6884 Jaw pain: Secondary | ICD-10-CM | POA: Diagnosis not present

## 2022-01-03 DIAGNOSIS — R6884 Jaw pain: Secondary | ICD-10-CM | POA: Diagnosis not present

## 2022-01-03 DIAGNOSIS — G43709 Chronic migraine without aura, not intractable, without status migrainosus: Secondary | ICD-10-CM | POA: Diagnosis not present

## 2022-01-17 DIAGNOSIS — G43709 Chronic migraine without aura, not intractable, without status migrainosus: Secondary | ICD-10-CM | POA: Diagnosis not present

## 2022-01-17 DIAGNOSIS — R6884 Jaw pain: Secondary | ICD-10-CM | POA: Diagnosis not present

## 2022-01-25 DIAGNOSIS — G40219 Localization-related (focal) (partial) symptomatic epilepsy and epileptic syndromes with complex partial seizures, intractable, without status epilepticus: Secondary | ICD-10-CM | POA: Diagnosis not present

## 2022-01-25 DIAGNOSIS — G43109 Migraine with aura, not intractable, without status migrainosus: Secondary | ICD-10-CM | POA: Diagnosis not present

## 2022-01-25 DIAGNOSIS — R6884 Jaw pain: Secondary | ICD-10-CM | POA: Diagnosis not present

## 2022-01-25 DIAGNOSIS — G43709 Chronic migraine without aura, not intractable, without status migrainosus: Secondary | ICD-10-CM | POA: Diagnosis not present

## 2022-01-31 DIAGNOSIS — R6884 Jaw pain: Secondary | ICD-10-CM | POA: Diagnosis not present

## 2022-01-31 DIAGNOSIS — G43709 Chronic migraine without aura, not intractable, without status migrainosus: Secondary | ICD-10-CM | POA: Diagnosis not present

## 2022-02-14 DIAGNOSIS — G43709 Chronic migraine without aura, not intractable, without status migrainosus: Secondary | ICD-10-CM | POA: Diagnosis not present

## 2022-02-14 DIAGNOSIS — R6884 Jaw pain: Secondary | ICD-10-CM | POA: Diagnosis not present

## 2022-03-07 ENCOUNTER — Other Ambulatory Visit: Payer: Self-pay | Admitting: Obstetrics and Gynecology

## 2022-03-07 ENCOUNTER — Ambulatory Visit
Admission: RE | Admit: 2022-03-07 | Discharge: 2022-03-07 | Disposition: A | Discharge status: Home / Self Care | Payer: BC Managed Care – PPO | Source: Ambulatory Visit | Attending: Obstetrics and Gynecology | Admitting: Obstetrics and Gynecology

## 2022-03-07 DIAGNOSIS — Z1231 Encounter for screening mammogram for malignant neoplasm of breast: Secondary | ICD-10-CM

## 2022-03-07 DIAGNOSIS — G43709 Chronic migraine without aura, not intractable, without status migrainosus: Secondary | ICD-10-CM | POA: Diagnosis not present

## 2022-03-07 DIAGNOSIS — R6884 Jaw pain: Secondary | ICD-10-CM | POA: Diagnosis not present

## 2022-03-28 DIAGNOSIS — R6884 Jaw pain: Secondary | ICD-10-CM | POA: Diagnosis not present

## 2022-03-28 DIAGNOSIS — G43709 Chronic migraine without aura, not intractable, without status migrainosus: Secondary | ICD-10-CM | POA: Diagnosis not present

## 2022-05-18 DIAGNOSIS — G4733 Obstructive sleep apnea (adult) (pediatric): Secondary | ICD-10-CM | POA: Diagnosis not present

## 2022-06-08 DIAGNOSIS — Z124 Encounter for screening for malignant neoplasm of cervix: Secondary | ICD-10-CM | POA: Diagnosis not present

## 2022-06-08 DIAGNOSIS — Z6822 Body mass index (BMI) 22.0-22.9, adult: Secondary | ICD-10-CM | POA: Diagnosis not present

## 2022-06-08 DIAGNOSIS — Z01419 Encounter for gynecological examination (general) (routine) without abnormal findings: Secondary | ICD-10-CM | POA: Diagnosis not present

## 2022-06-08 DIAGNOSIS — R319 Hematuria, unspecified: Secondary | ICD-10-CM | POA: Diagnosis not present

## 2022-06-18 DIAGNOSIS — G4733 Obstructive sleep apnea (adult) (pediatric): Secondary | ICD-10-CM | POA: Diagnosis not present

## 2022-06-25 DIAGNOSIS — Z7184 Encounter for health counseling related to travel: Secondary | ICD-10-CM | POA: Diagnosis not present

## 2022-06-25 DIAGNOSIS — Z23 Encounter for immunization: Secondary | ICD-10-CM | POA: Diagnosis not present

## 2022-07-09 DIAGNOSIS — Z13220 Encounter for screening for lipoid disorders: Secondary | ICD-10-CM | POA: Diagnosis not present

## 2022-07-19 DIAGNOSIS — G4733 Obstructive sleep apnea (adult) (pediatric): Secondary | ICD-10-CM | POA: Diagnosis not present

## 2022-08-16 DIAGNOSIS — G4733 Obstructive sleep apnea (adult) (pediatric): Secondary | ICD-10-CM | POA: Diagnosis not present

## 2022-08-19 DIAGNOSIS — G4733 Obstructive sleep apnea (adult) (pediatric): Secondary | ICD-10-CM | POA: Diagnosis not present

## 2022-09-10 DIAGNOSIS — M79643 Pain in unspecified hand: Secondary | ICD-10-CM | POA: Diagnosis not present

## 2022-09-10 DIAGNOSIS — M180 Bilateral primary osteoarthritis of first carpometacarpal joints: Secondary | ICD-10-CM | POA: Diagnosis not present

## 2022-09-10 DIAGNOSIS — M941 Relapsing polychondritis: Secondary | ICD-10-CM | POA: Diagnosis not present

## 2022-09-10 DIAGNOSIS — Z13820 Encounter for screening for osteoporosis: Secondary | ICD-10-CM | POA: Diagnosis not present

## 2022-09-10 DIAGNOSIS — Z79899 Other long term (current) drug therapy: Secondary | ICD-10-CM | POA: Diagnosis not present

## 2022-09-16 DIAGNOSIS — G4733 Obstructive sleep apnea (adult) (pediatric): Secondary | ICD-10-CM | POA: Diagnosis not present

## 2022-10-16 DIAGNOSIS — G4733 Obstructive sleep apnea (adult) (pediatric): Secondary | ICD-10-CM | POA: Diagnosis not present

## 2022-10-19 DIAGNOSIS — R059 Cough, unspecified: Secondary | ICD-10-CM | POA: Diagnosis not present

## 2022-10-19 DIAGNOSIS — R0981 Nasal congestion: Secondary | ICD-10-CM | POA: Diagnosis not present

## 2022-10-19 DIAGNOSIS — R0982 Postnasal drip: Secondary | ICD-10-CM | POA: Diagnosis not present

## 2022-10-19 DIAGNOSIS — Z20822 Contact with and (suspected) exposure to covid-19: Secondary | ICD-10-CM | POA: Diagnosis not present

## 2022-11-01 DIAGNOSIS — M8580 Other specified disorders of bone density and structure, unspecified site: Secondary | ICD-10-CM | POA: Diagnosis not present

## 2022-11-01 DIAGNOSIS — R7301 Impaired fasting glucose: Secondary | ICD-10-CM | POA: Diagnosis not present

## 2022-11-02 DIAGNOSIS — Z1331 Encounter for screening for depression: Secondary | ICD-10-CM | POA: Diagnosis not present

## 2022-11-02 DIAGNOSIS — Z1339 Encounter for screening examination for other mental health and behavioral disorders: Secondary | ICD-10-CM | POA: Diagnosis not present

## 2022-11-02 DIAGNOSIS — R82998 Other abnormal findings in urine: Secondary | ICD-10-CM | POA: Diagnosis not present

## 2022-11-02 DIAGNOSIS — Z0000 Encounter for general adult medical examination without abnormal findings: Secondary | ICD-10-CM | POA: Diagnosis not present

## 2022-11-02 DIAGNOSIS — R7301 Impaired fasting glucose: Secondary | ICD-10-CM | POA: Diagnosis not present

## 2022-11-05 ENCOUNTER — Other Ambulatory Visit: Payer: Self-pay | Admitting: Internal Medicine

## 2022-11-05 DIAGNOSIS — E785 Hyperlipidemia, unspecified: Secondary | ICD-10-CM

## 2022-11-14 DIAGNOSIS — G4733 Obstructive sleep apnea (adult) (pediatric): Secondary | ICD-10-CM | POA: Diagnosis not present

## 2022-11-19 DIAGNOSIS — G4733 Obstructive sleep apnea (adult) (pediatric): Secondary | ICD-10-CM | POA: Diagnosis not present

## 2022-11-23 ENCOUNTER — Other Ambulatory Visit: Payer: Self-pay | Admitting: Sports Medicine

## 2022-11-23 DIAGNOSIS — H15003 Unspecified scleritis, bilateral: Secondary | ICD-10-CM | POA: Diagnosis not present

## 2022-11-23 DIAGNOSIS — M546 Pain in thoracic spine: Secondary | ICD-10-CM | POA: Diagnosis not present

## 2022-11-23 DIAGNOSIS — M9908 Segmental and somatic dysfunction of rib cage: Secondary | ICD-10-CM | POA: Diagnosis not present

## 2022-11-23 DIAGNOSIS — M542 Cervicalgia: Secondary | ICD-10-CM | POA: Diagnosis not present

## 2022-11-23 DIAGNOSIS — H04123 Dry eye syndrome of bilateral lacrimal glands: Secondary | ICD-10-CM | POA: Diagnosis not present

## 2022-11-23 DIAGNOSIS — M9902 Segmental and somatic dysfunction of thoracic region: Secondary | ICD-10-CM | POA: Diagnosis not present

## 2022-11-23 DIAGNOSIS — M9901 Segmental and somatic dysfunction of cervical region: Secondary | ICD-10-CM | POA: Diagnosis not present

## 2022-11-23 DIAGNOSIS — M941 Relapsing polychondritis: Secondary | ICD-10-CM | POA: Diagnosis not present

## 2022-11-23 DIAGNOSIS — M25511 Pain in right shoulder: Secondary | ICD-10-CM | POA: Diagnosis not present

## 2022-11-23 DIAGNOSIS — M25512 Pain in left shoulder: Secondary | ICD-10-CM | POA: Diagnosis not present

## 2022-11-23 DIAGNOSIS — H43811 Vitreous degeneration, right eye: Secondary | ICD-10-CM | POA: Diagnosis not present

## 2022-11-23 DIAGNOSIS — H2513 Age-related nuclear cataract, bilateral: Secondary | ICD-10-CM | POA: Diagnosis not present

## 2022-12-04 DIAGNOSIS — M9901 Segmental and somatic dysfunction of cervical region: Secondary | ICD-10-CM | POA: Diagnosis not present

## 2022-12-04 DIAGNOSIS — M9902 Segmental and somatic dysfunction of thoracic region: Secondary | ICD-10-CM | POA: Diagnosis not present

## 2022-12-04 DIAGNOSIS — M546 Pain in thoracic spine: Secondary | ICD-10-CM | POA: Diagnosis not present

## 2022-12-04 DIAGNOSIS — M9908 Segmental and somatic dysfunction of rib cage: Secondary | ICD-10-CM | POA: Diagnosis not present

## 2022-12-05 ENCOUNTER — Ambulatory Visit
Admission: RE | Admit: 2022-12-05 | Discharge: 2022-12-05 | Disposition: A | Discharge status: Home / Self Care | Payer: BC Managed Care – PPO | Source: Ambulatory Visit | Attending: Internal Medicine | Admitting: Internal Medicine

## 2022-12-05 DIAGNOSIS — E785 Hyperlipidemia, unspecified: Secondary | ICD-10-CM

## 2022-12-05 DIAGNOSIS — Z8249 Family history of ischemic heart disease and other diseases of the circulatory system: Secondary | ICD-10-CM | POA: Diagnosis not present

## 2022-12-15 DIAGNOSIS — G4733 Obstructive sleep apnea (adult) (pediatric): Secondary | ICD-10-CM | POA: Diagnosis not present

## 2022-12-24 DIAGNOSIS — M9902 Segmental and somatic dysfunction of thoracic region: Secondary | ICD-10-CM | POA: Diagnosis not present

## 2022-12-24 DIAGNOSIS — M25512 Pain in left shoulder: Secondary | ICD-10-CM | POA: Diagnosis not present

## 2022-12-24 DIAGNOSIS — M542 Cervicalgia: Secondary | ICD-10-CM | POA: Diagnosis not present

## 2022-12-24 DIAGNOSIS — M25511 Pain in right shoulder: Secondary | ICD-10-CM | POA: Diagnosis not present

## 2023-01-13 DIAGNOSIS — G4733 Obstructive sleep apnea (adult) (pediatric): Secondary | ICD-10-CM | POA: Diagnosis not present

## 2023-02-13 DIAGNOSIS — G4733 Obstructive sleep apnea (adult) (pediatric): Secondary | ICD-10-CM | POA: Diagnosis not present

## 2023-02-18 DIAGNOSIS — G4733 Obstructive sleep apnea (adult) (pediatric): Secondary | ICD-10-CM | POA: Diagnosis not present

## 2023-02-22 DIAGNOSIS — M542 Cervicalgia: Secondary | ICD-10-CM | POA: Diagnosis not present

## 2023-02-22 DIAGNOSIS — M25511 Pain in right shoulder: Secondary | ICD-10-CM | POA: Diagnosis not present

## 2023-02-22 DIAGNOSIS — M546 Pain in thoracic spine: Secondary | ICD-10-CM | POA: Diagnosis not present

## 2023-02-22 DIAGNOSIS — M25512 Pain in left shoulder: Secondary | ICD-10-CM | POA: Diagnosis not present

## 2023-03-15 DIAGNOSIS — G4733 Obstructive sleep apnea (adult) (pediatric): Secondary | ICD-10-CM | POA: Diagnosis not present

## 2023-04-08 DIAGNOSIS — M25511 Pain in right shoulder: Secondary | ICD-10-CM | POA: Diagnosis not present

## 2023-04-08 DIAGNOSIS — M542 Cervicalgia: Secondary | ICD-10-CM | POA: Diagnosis not present

## 2023-04-08 DIAGNOSIS — M546 Pain in thoracic spine: Secondary | ICD-10-CM | POA: Diagnosis not present

## 2023-04-08 DIAGNOSIS — M941 Relapsing polychondritis: Secondary | ICD-10-CM | POA: Diagnosis not present

## 2023-04-08 DIAGNOSIS — M180 Bilateral primary osteoarthritis of first carpometacarpal joints: Secondary | ICD-10-CM | POA: Diagnosis not present

## 2023-04-08 DIAGNOSIS — M79643 Pain in unspecified hand: Secondary | ICD-10-CM | POA: Diagnosis not present

## 2023-04-08 DIAGNOSIS — M25512 Pain in left shoulder: Secondary | ICD-10-CM | POA: Diagnosis not present

## 2023-04-08 DIAGNOSIS — Z79899 Other long term (current) drug therapy: Secondary | ICD-10-CM | POA: Diagnosis not present

## 2023-04-15 DIAGNOSIS — G4733 Obstructive sleep apnea (adult) (pediatric): Secondary | ICD-10-CM | POA: Diagnosis not present

## 2023-05-14 ENCOUNTER — Other Ambulatory Visit: Payer: Self-pay | Admitting: Obstetrics and Gynecology

## 2023-05-14 DIAGNOSIS — Z1231 Encounter for screening mammogram for malignant neoplasm of breast: Secondary | ICD-10-CM

## 2023-05-15 ENCOUNTER — Ambulatory Visit
Admission: RE | Admit: 2023-05-15 | Discharge: 2023-05-15 | Disposition: A | Discharge status: Home / Self Care | Payer: BC Managed Care – PPO | Source: Ambulatory Visit | Attending: Obstetrics and Gynecology | Admitting: Obstetrics and Gynecology

## 2023-05-15 DIAGNOSIS — Z1231 Encounter for screening mammogram for malignant neoplasm of breast: Secondary | ICD-10-CM | POA: Diagnosis not present

## 2023-06-08 DIAGNOSIS — J019 Acute sinusitis, unspecified: Secondary | ICD-10-CM | POA: Diagnosis not present

## 2023-06-08 DIAGNOSIS — R030 Elevated blood-pressure reading, without diagnosis of hypertension: Secondary | ICD-10-CM | POA: Diagnosis not present

## 2023-06-10 DIAGNOSIS — M546 Pain in thoracic spine: Secondary | ICD-10-CM | POA: Diagnosis not present

## 2023-06-10 DIAGNOSIS — M25512 Pain in left shoulder: Secondary | ICD-10-CM | POA: Diagnosis not present

## 2023-06-10 DIAGNOSIS — M25511 Pain in right shoulder: Secondary | ICD-10-CM | POA: Diagnosis not present

## 2023-06-10 DIAGNOSIS — M542 Cervicalgia: Secondary | ICD-10-CM | POA: Diagnosis not present

## 2023-06-14 DIAGNOSIS — G4733 Obstructive sleep apnea (adult) (pediatric): Secondary | ICD-10-CM | POA: Diagnosis not present

## 2023-07-15 DIAGNOSIS — G4733 Obstructive sleep apnea (adult) (pediatric): Secondary | ICD-10-CM | POA: Diagnosis not present

## 2023-07-29 DIAGNOSIS — R0981 Nasal congestion: Secondary | ICD-10-CM | POA: Diagnosis not present

## 2023-07-29 DIAGNOSIS — J019 Acute sinusitis, unspecified: Secondary | ICD-10-CM | POA: Diagnosis not present

## 2023-07-29 DIAGNOSIS — B9689 Other specified bacterial agents as the cause of diseases classified elsewhere: Secondary | ICD-10-CM | POA: Diagnosis not present

## 2023-08-09 DIAGNOSIS — M25512 Pain in left shoulder: Secondary | ICD-10-CM | POA: Diagnosis not present

## 2023-08-09 DIAGNOSIS — M25511 Pain in right shoulder: Secondary | ICD-10-CM | POA: Diagnosis not present

## 2023-08-09 DIAGNOSIS — M791 Myalgia, unspecified site: Secondary | ICD-10-CM | POA: Diagnosis not present

## 2023-08-09 DIAGNOSIS — M542 Cervicalgia: Secondary | ICD-10-CM | POA: Diagnosis not present

## 2023-08-09 DIAGNOSIS — M9908 Segmental and somatic dysfunction of rib cage: Secondary | ICD-10-CM | POA: Diagnosis not present

## 2023-08-09 DIAGNOSIS — M9901 Segmental and somatic dysfunction of cervical region: Secondary | ICD-10-CM | POA: Diagnosis not present

## 2023-08-09 DIAGNOSIS — M9907 Segmental and somatic dysfunction of upper extremity: Secondary | ICD-10-CM | POA: Diagnosis not present

## 2023-08-09 DIAGNOSIS — M9902 Segmental and somatic dysfunction of thoracic region: Secondary | ICD-10-CM | POA: Diagnosis not present

## 2023-08-14 DIAGNOSIS — G4733 Obstructive sleep apnea (adult) (pediatric): Secondary | ICD-10-CM | POA: Diagnosis not present

## 2023-08-19 DIAGNOSIS — Z124 Encounter for screening for malignant neoplasm of cervix: Secondary | ICD-10-CM | POA: Diagnosis not present

## 2023-08-19 DIAGNOSIS — Z01419 Encounter for gynecological examination (general) (routine) without abnormal findings: Secondary | ICD-10-CM | POA: Diagnosis not present

## 2023-08-19 DIAGNOSIS — Z6822 Body mass index (BMI) 22.0-22.9, adult: Secondary | ICD-10-CM | POA: Diagnosis not present

## 2023-08-26 DIAGNOSIS — Z1331 Encounter for screening for depression: Secondary | ICD-10-CM | POA: Diagnosis not present

## 2023-08-26 DIAGNOSIS — G43709 Chronic migraine without aura, not intractable, without status migrainosus: Secondary | ICD-10-CM | POA: Diagnosis not present

## 2023-08-26 DIAGNOSIS — G40219 Localization-related (focal) (partial) symptomatic epilepsy and epileptic syndromes with complex partial seizures, intractable, without status epilepticus: Secondary | ICD-10-CM | POA: Diagnosis not present

## 2023-09-09 DIAGNOSIS — H02889 Meibomian gland dysfunction of unspecified eye, unspecified eyelid: Secondary | ICD-10-CM | POA: Diagnosis not present

## 2023-09-16 DIAGNOSIS — G4733 Obstructive sleep apnea (adult) (pediatric): Secondary | ICD-10-CM | POA: Diagnosis not present

## 2023-10-02 ENCOUNTER — Other Ambulatory Visit: Payer: Self-pay | Admitting: Obstetrics and Gynecology

## 2023-10-02 DIAGNOSIS — R92333 Mammographic heterogeneous density, bilateral breasts: Secondary | ICD-10-CM

## 2023-10-14 DIAGNOSIS — M5412 Radiculopathy, cervical region: Secondary | ICD-10-CM | POA: Diagnosis not present

## 2023-10-14 DIAGNOSIS — M9907 Segmental and somatic dysfunction of upper extremity: Secondary | ICD-10-CM | POA: Diagnosis not present

## 2023-10-14 DIAGNOSIS — M542 Cervicalgia: Secondary | ICD-10-CM | POA: Diagnosis not present

## 2023-10-14 DIAGNOSIS — M25512 Pain in left shoulder: Secondary | ICD-10-CM | POA: Diagnosis not present

## 2023-10-14 DIAGNOSIS — M9908 Segmental and somatic dysfunction of rib cage: Secondary | ICD-10-CM | POA: Diagnosis not present

## 2023-10-14 DIAGNOSIS — M25511 Pain in right shoulder: Secondary | ICD-10-CM | POA: Diagnosis not present

## 2023-10-14 DIAGNOSIS — M9901 Segmental and somatic dysfunction of cervical region: Secondary | ICD-10-CM | POA: Diagnosis not present

## 2023-10-14 DIAGNOSIS — M9902 Segmental and somatic dysfunction of thoracic region: Secondary | ICD-10-CM | POA: Diagnosis not present

## 2023-10-16 DIAGNOSIS — G4733 Obstructive sleep apnea (adult) (pediatric): Secondary | ICD-10-CM | POA: Diagnosis not present

## 2023-10-27 ENCOUNTER — Ambulatory Visit: Payer: BC Managed Care – PPO

## 2023-11-04 DIAGNOSIS — E785 Hyperlipidemia, unspecified: Secondary | ICD-10-CM | POA: Diagnosis not present

## 2023-11-04 DIAGNOSIS — M8580 Other specified disorders of bone density and structure, unspecified site: Secondary | ICD-10-CM | POA: Diagnosis not present

## 2023-11-04 DIAGNOSIS — R7301 Impaired fasting glucose: Secondary | ICD-10-CM | POA: Diagnosis not present

## 2023-11-11 DIAGNOSIS — Z1331 Encounter for screening for depression: Secondary | ICD-10-CM | POA: Diagnosis not present

## 2023-11-11 DIAGNOSIS — R82998 Other abnormal findings in urine: Secondary | ICD-10-CM | POA: Diagnosis not present

## 2023-11-11 DIAGNOSIS — Z0000 Encounter for general adult medical examination without abnormal findings: Secondary | ICD-10-CM | POA: Diagnosis not present

## 2023-11-11 DIAGNOSIS — Z1339 Encounter for screening examination for other mental health and behavioral disorders: Secondary | ICD-10-CM | POA: Diagnosis not present

## 2023-11-11 DIAGNOSIS — M8580 Other specified disorders of bone density and structure, unspecified site: Secondary | ICD-10-CM | POA: Diagnosis not present

## 2023-11-16 DIAGNOSIS — G4733 Obstructive sleep apnea (adult) (pediatric): Secondary | ICD-10-CM | POA: Diagnosis not present

## 2023-12-05 ENCOUNTER — Encounter: Payer: Self-pay | Admitting: Podiatry

## 2023-12-05 ENCOUNTER — Ambulatory Visit (INDEPENDENT_AMBULATORY_CARE_PROVIDER_SITE_OTHER): Payer: PRIVATE HEALTH INSURANCE

## 2023-12-05 ENCOUNTER — Ambulatory Visit (INDEPENDENT_AMBULATORY_CARE_PROVIDER_SITE_OTHER): Payer: PRIVATE HEALTH INSURANCE | Admitting: Podiatry

## 2023-12-05 DIAGNOSIS — M67472 Ganglion, left ankle and foot: Secondary | ICD-10-CM

## 2023-12-05 DIAGNOSIS — M6740 Ganglion, unspecified site: Secondary | ICD-10-CM

## 2023-12-05 DIAGNOSIS — M79675 Pain in left toe(s): Secondary | ICD-10-CM

## 2023-12-05 MED ORDER — TRIAMCINOLONE ACETONIDE 10 MG/ML IJ SUSP
10.0000 mg | Freq: Once | INTRAMUSCULAR | Status: AC
  Administered 2023-12-05: 10 mg via INTRA_ARTICULAR

## 2023-12-06 NOTE — Progress Notes (Signed)
 Subjective:   Patient ID: Emily Fisher, female   DOB: 58 y.o.   MRN: [id number]   HPI Patient presents stating she has a lump over the last 6 to 8 weeks on the bottom of her second toe that is been painful.  She does not remember injury to the area but it has been somewhat hard to walk on and is slightly better but still very sore.  Patient does not smoke likes to be active   Review of Systems  All other systems reviewed and are negative.       Objective:  Physical Exam Vitals and nursing note reviewed.  Constitutional:      Appearance: She is well-developed.  Pulmonary:     Effort: Pulmonary effort is normal.  Musculoskeletal:        General: Normal range of motion.  Skin:    General: Skin is warm.  Neurological:     Mental Status: She is alert.     Neurovascular status intact muscle strength was found to be adequate range of motion adequate with exquisite discomfort plantar second digit at the inner phalangeal joint with a moving nodule measuring about 5 x 5 mm with discoloration.  Patient does have moderate immunosuppression has good digital perfusion well oriented x 3     Assessment:  Possibility for ganglionic cyst or other inflammatory lesion plantar left with possibility for bone structure issue     Plan:  H&P x-ray reviewed today I did a sterile prep I injected the ganglion on 2 mg Dexasone Kenalog  5 mg Xylocaine  and did get out a small bit of fluid.  Applied compression to the toe instructed on warm compresses and if symptoms return I want to see her back  X-rays indicate that there is no indication of calcification of this area or bone spur formation

## 2024-06-03 ENCOUNTER — Encounter: Payer: Self-pay | Admitting: Internal Medicine

## 2024-07-06 ENCOUNTER — Encounter: Payer: Self-pay | Admitting: Internal Medicine

## 2024-07-22 ENCOUNTER — Other Ambulatory Visit: Payer: Self-pay | Admitting: Obstetrics and Gynecology

## 2024-07-22 DIAGNOSIS — Z1231 Encounter for screening mammogram for malignant neoplasm of breast: Secondary | ICD-10-CM

## 2024-08-12 ENCOUNTER — Telehealth: Payer: Self-pay

## 2024-08-12 NOTE — Telephone Encounter (Signed)
 Copied from CRM [id number]. Topic: General - Call Back - No Documentation >> Aug 12, 2024 12:46 PM Emily Fisher wrote: Reason for CRM: Patient returning missed phone call - states she will bring in her SD card for her CPAP at tomorrows appointment.  Put message in appointment note

## 2024-08-12 NOTE — Progress Notes (Signed)
 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 Husband has been telling her she snores, but hasn't described apnea. Worse if stuffy nose. Usually feels rested. Caffeine 2 cups/ day. No ENT surgery. Nl thyroid.  No recognized limb movement or other disturbance. Sleep schedule - usual bedtime 9-11PM, latency 2 minutes, waking 1-2x before up 5:30 AM. Two weeks/ year rotate shift to nights.  Father on CPAP.  12/28/14- 57 yoF never smoker Radiologist referred courtesy of Dr Mat for sleep medicine evaluation FOLLOWS FOR: Pt here to go over results of sleep study. Unattended home sleep study: 09/28/14  Mild obstructive and central sleep apnea 13.3/ hr Admits some easy sleepiness relative to her peers. Husband reporting less snoring.  03/29/15-48 yoF never smoker Radiologist followed for OSA/ CPAP CPAP 7/ Advanced FOLLOWS FOR: Pt states she is wearing CPAP every night for about 6-7 hours; DME is [facility]. Pressure was never increased. She says current pressure still makes her feel a little gassy. Using nasal mask and humidifier. Download confirms good compliance and control.  03/27/2016-58 year old female never smoker Radiologist, followed for OSA/CPAP CPAP 6/ Advanced FOLLOWS QNM:Inpwh well with CPAP. Wears each night avg. of 7 hrs.Pr. Good. Download shows adequate compliance-77% at four-hour minimum goal with excellent control, pressure 6. We discussed comfort and convenience. She doesn't take it when she travels. She asks about simpler options. We discussed portable CPAP machines and oral appliances. I think she might be a very good candidate for an oral appliance. We  also discussed surgery.   08/13/24- 58 yoF never smoker Radiologist, followed for Complex Sleep Apnea/CPAP, complicated by Migraine, Relapsing Polychondritis, Complex Partial Sezures, CPAP 6/ Advanced Body weight today-132 lbs We referred to Dr Micky in 2017 to consider OAP. Discussed the use of AI scribe software for clinical note transcription with the patient, who gave verbal consent to proceed.  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  She uses her CPAP machine nightly with a fixed pressure setting of six, achieving less than two breakthrough apneas per hour. She is satisfied she still needs it.  She had tried an oral appliance but didn't like it.  Her baseline AHI was approximately thirteen. She underwent a home sleep study confirming mild sleep apnea and is interested in repeating the test to assess any changes.  She experiences improved sleep quality and well-being with CPAP therapy and has been using her current machine for over five years. She is interested in acquiring a travel CPAP machine, having previously found a portable model convenient for travel. We discussed Inspire for her information, but preferred candidates are usually more severe.     Assessment and Plan:    Obstructive sleep apnea Obstructive sleep apnea with baseline AHI of 13, well-managed with CPAP. Inspire surgery discussed but not recommended due to mild severity and effective CPAP management. - Order home sleep test to reassess AHI without CPAP. - Discuss home sleep test results once available. - Consider new CPAP machine as current one is over 41 years old. - Advise contacting home care company for CPAP replacement. - Provide prescription for travel CPAP machine if desired.     ROS-see HPI Constitutional:   No-  weight loss, night sweats, fevers, chills, fatigue, lassitude. HEENT:   +  headaches,  difficulty swallowing, tooth/dental problems, sore throat,       No-  sneezing, itching, ear ache, nasal congestion, post nasal drip,  CV:  No-   chest pain, orthopnea, PND, swelling in lower extremities, anasarca, dizziness, palpitations Resp: No-   shortness of breath with exertion or at rest.              No-   productive cough,  + non-productive cough,  No- coughing up of blood.  No-   change in color of mucus.  No- wheezing.   Skin: No-   rash or lesions. GI:  +heartburn, indigestion, abdominal pain, nausea, vomiting,  GU:  MS:  No-   joint pain or swelling.   Neuro-     nothing unusual Psych:  No- change in mood or affect. No depression or anxiety.  No memory loss.  OBJ- Physical Exam General- Alert, Oriented, Affect-appropriate, Distress- none acute, +slender Skin- rash-none, lesions- none, excoriation- none Lymphadenopathy- none Head- atraumatic            Eyes- Gross vision intact, PERRLA, conjunctivae and secretions clear            Ears- Hearing, canals-normal            Nose- Clear, no-Septal dev, mucus, polyps, erosion, perforation             Throat- Mallampati III , mucosa clear , drainage- none, tonsils- atrophic Neck- flexible , trachea midline, no stridor , thyroid nl, carotid no bruit Chest - symmetrical excursion , unlabored           Heart/CV- RRR , no murmur , no gallop  , no rub, nl s1 s2                           - JVD- none , edema- none, stasis changes- none, varices- none           Lung- clear to P&A, wheeze- none, cough- none , dullness-none, rub- none           Chest wall-  Abd-  Br/ Gen/ Rectal- Not done, not indicated Extrem- cyanosis- none, clubbing, none, atrophy- none, strength- nl Neuro- grossly intact to observation

## 2024-08-13 ENCOUNTER — Encounter: Payer: Self-pay | Admitting: Internal Medicine

## 2024-08-13 ENCOUNTER — Ambulatory Visit (INDEPENDENT_AMBULATORY_CARE_PROVIDER_SITE_OTHER): Payer: PRIVATE HEALTH INSURANCE | Admitting: Internal Medicine

## 2024-08-13 VITALS — BP 112/78 | HR 69 | Temp 98.0°F | Ht 65.0 in | Wt 132.0 lb

## 2024-08-13 DIAGNOSIS — G4733 Obstructive sleep apnea (adult) (pediatric): Secondary | ICD-10-CM

## 2024-08-13 NOTE — Patient Instructions (Signed)
 Order- schedule home sleep test off CPAP    dx OSA  Please call us  about 2 weeks after your test for results and recommendations

## 2024-08-14 ENCOUNTER — Telehealth: Payer: Self-pay | Admitting: Internal Medicine

## 2024-08-14 DIAGNOSIS — G4733 Obstructive sleep apnea (adult) (pediatric): Secondary | ICD-10-CM

## 2024-08-14 NOTE — Telephone Encounter (Signed)
 Printed script for travel home use CPAP for Dr. Neysa to sign.  Will mail rx to patients home address.  Done

## 2024-08-14 NOTE — Telephone Encounter (Signed)
 Order-- Mail Dr Delores a printed script for  Travel CPAP machine of choice (suggest Transcend or AirMini, others may be fine)- set pressure 6, mask of choice, supplies, hoses, filters, download capability SD card    dx OSA -CD Neysa, MD

## 2024-08-22 DIAGNOSIS — G4733 Obstructive sleep apnea (adult) (pediatric): Secondary | ICD-10-CM

## 2024-08-24 ENCOUNTER — Ambulatory Visit: Payer: PRIVATE HEALTH INSURANCE

## 2024-08-27 ENCOUNTER — Ambulatory Visit
Admission: RE | Admit: 2024-08-27 | Discharge: 2024-08-27 | Disposition: A | Discharge status: Home / Self Care | Payer: PRIVATE HEALTH INSURANCE | Source: Ambulatory Visit | Attending: Obstetrics and Gynecology | Admitting: Obstetrics and Gynecology

## 2024-08-27 DIAGNOSIS — Z1231 Encounter for screening mammogram for malignant neoplasm of breast: Secondary | ICD-10-CM

## 2024-09-06 DIAGNOSIS — R0683 Snoring: Secondary | ICD-10-CM | POA: Diagnosis not present

## 2024-09-23 ENCOUNTER — Encounter: Payer: Self-pay | Admitting: Internal Medicine

## 2024-09-29 NOTE — Telephone Encounter (Signed)
 We can certainly help Dr Delores replace her old CPAP machine

## 2024-09-29 NOTE — Telephone Encounter (Signed)
 Dr. Maple Hudson, Please see patient message and advise.  Thank you.

## 2024-09-29 NOTE — Telephone Encounter (Signed)
 We can certainly help Dr Delores replace her old CPAP machine. We can either send order directly to her DME company, or provide  her with a printed prescription, whichever she prefers:  Order- new CPAP machine (prefer ResMed AirFit 11)  autopap 4-10, mask of choice, humidifier, supplies, AirView/ SD card

## 2024-10-15 ENCOUNTER — Other Ambulatory Visit: Payer: Self-pay | Admitting: *Deleted

## 2024-10-15 DIAGNOSIS — G4733 Obstructive sleep apnea (adult) (pediatric): Secondary | ICD-10-CM

## 2024-10-19 NOTE — Telephone Encounter (Signed)
 Copied from CRM [id number]. Topic: Clinical - Order For Equipment >> Oct 15, 2024  8:31 AM Russell PARAS wrote: Reason for CRM:   Pt is contacting clinic to speak with Amy. She had spoken with her earlier in the month about having a paper prescription for her HOME CPAP machine sent to her  home. She has not received it and has reached out through MyChart several times without response.   She reports getting the paper prescription in 07/2024 or 08/2024 for the PORTABLE CPAP machine, but she is now requiring the home machine as well.   Verified correct address on file.   Pt requested call back with status update once the prescription has been mailed  CB#  [phone]  -----------------------------------------------------------------------------------------------------------------------------------------------  I called and spoke with patient, she did received the script on mychart, however would also like a paper script.  I verified her mailing address and told her I would put a paper script in the mail to her.  She verbalized understanding.  She was appreciative.  Script put in mail.  Nothing further needed.

## 2024-10-21 ENCOUNTER — Telehealth: Payer: Self-pay

## 2024-10-21 NOTE — Telephone Encounter (Signed)
 Called patient.  Patient was able to print the copy of the CPAP order that was sent via MyChart.  She will contact the office if she does not get the original signed order that was mailed to her per [facility] (if this is needed) for the home CPAP unit.  Patient states nothing further needed at this time.  Will close this encounter.

## 2024-11-02 ENCOUNTER — Ambulatory Visit: Payer: PRIVATE HEALTH INSURANCE

## 2024-11-02 VITALS — BP 120/78 | HR 63 | Temp 97.5°F | Ht 65.0 in | Wt 132.8 lb

## 2024-11-02 DIAGNOSIS — G4733 Obstructive sleep apnea (adult) (pediatric): Secondary | ICD-10-CM | POA: Diagnosis not present

## 2024-11-02 DIAGNOSIS — F419 Anxiety disorder, unspecified: Secondary | ICD-10-CM | POA: Diagnosis not present

## 2024-11-02 NOTE — Patient Instructions (Signed)
" °  VISIT SUMMARY: You came in today for a follow-up visit regarding your sleep apnea. You have been managing your condition well with the use of a CPAP machine since your diagnosis in 2015. You reported that you are feeling refreshed in the mornings and are able to fall back asleep if you wake up during the night. We also discussed your history of anxiety and current medication regimen.  YOUR PLAN: -SLEEP APNEA (OBSTRUCTIVE AND CENTRAL): Sleep apnea is a condition where your breathing stops and starts repeatedly during sleep. You have a mild form of sleep apnea with both obstructive and central events, which is effectively managed with your CPAP machine set at a pressure of six. Continue using your CPAP machine at the current settings and clean the equipment weekly. Inspire therapy and dental devices are not suitable for you due to the central component of your sleep apnea and TMJ issues. Follow up with [facility] for processing a new CPAP machine.  INSTRUCTIONS: Please schedule a follow-up appointment in one year.                      Contains text generated by Abridge.                                 Contains text generated by Abridge.   "

## 2024-11-02 NOTE — Progress Notes (Signed)
 "  Pulmonology Office Visit   Subjective:  Patient ID: Emily Fisher, female    DOB: 1965-12-14  MRN: [id number]  Referred by: Loreli Elsie JONETTA Mickey., MD  CC:  Chief Complaint  Patient presents with   Sleep Apnea    Sleeping well.    HPI Emily Fisher is a 59 y.o. female non-smoker with relapsing polychondritis seizure disorder migraine and OSA who is radiologist by profession.  Was following Dr. Neysa.  Here for OSA management.  Respective notes from provider reviewed as appropriate to gather relevant information for patient care.   Discussed the use of AI scribe software for clinical note transcription with the patient, who gave verbal consent to proceed.  History of Present Illness   Emily Fisher is a 59 year old female with sleep apnea who presents for follow-up.  She was diagnosed with sleep apnea in 2015 and has been using a CPAP machine since then. She manages well with the CPAP, although she wishes she did not need it. She cleans the CPAP tubing daily and ensures the mask is cleaned and the water chamber is dried to prevent fungal growth.  Her CPAP is set at a pressure of six. She does not think she snores while using CPAP and does not wake up gasping or choking, and she feels refreshed in the morning. She is able to fall back asleep if she wakes up during the night.  She goes to bed around 8:30 to 9:00 PM and falls asleep quickly. She wakes up one to two times per night, sometimes due to tubing discomfort or stress-related thoughts. She wakes up at 4:30 AM and occasionally takes naps on weekends, which help her feel refreshed.  She has a history of anxiety and is currently taking Trintellix, which she finds helpful. She was previously on Lexapro but switched due to interactions with Vimpat, which she takes for seizures. Her anxiety is more of an issue than depression.      OSA history: Mild sleep apnea diagnosed in 2015.  Has been on CPAP.  Mandibular advancement  device did not work because of TMJ.  Repeat HST October 2025 with 26% centrals and mild OSA.  Currently on CPAP 6 cm H2O.  PAP download compliance data: [facility] Encore/Airview ResMed 10 in process of getting ResMed 11. Pressure: 6 cm H2O. Hours of usage: 7 hours 48 minutes. Days used >4hr: 30/30. Leak: None. AHI: 3.6.  PRIOR TESTS and IMAGING: PSG/HSAT: HST December 2015: Mild OSA and central sleep apnea AHI 13.3/h. HST October 2025: AHI 7.  7 [27% central] O2 nadir 87%, no significant desaturation.       No data to display          Allergies: Gluten meal Current Medications[1] Past Medical History:  Diagnosis Date   Breast mass, left    GERD (gastroesophageal reflux disease)    IBS (irritable bowel syndrome)    Migraine    Relapsing polychondritis    Scleritis    Scleritis    Sleep apnea    CPAP nightly   Past Surgical History:  Procedure Laterality Date   BREAST EXCISIONAL BIOPSY Bilateral    Benign Biopsies   BREAST LUMPECTOMY WITH RADIOACTIVE SEED LOCALIZATION Left 07/06/2016   Procedure: LEFT BREAST LUMPECTOMY WITH RADIOACTIVE SEED LOCALIZATION;  Surgeon: Morene Olives, MD;  Location: [facility];  Service: General;  Laterality: Left;   BREAST SURGERY     ESOPHAGEAL DILATION     TOOTH EXTRACTION  Family History  Problem Relation Age of Onset   Hypertension Father    Cancer Maternal Grandmother        Breast   Cancer Paternal Grandmother        Renal Cell   Breast cancer Neg Hx    Social History   Socioeconomic History   Marital status: Married    Spouse name: Not on file   Number of children: Not on file   Years of education: Not on file   Highest education level: Not on file  Occupational History   Not on file  Tobacco Use   Smoking status: Never   Smokeless tobacco: Never  Substance and Sexual Activity   Alcohol  use: Yes    Comment: social   Drug use: No   Sexual activity: Yes    Birth control/protection: Surgical   Other Topics Concern   Not on file  Social History Narrative   Not on file   Social Drivers of Health   Tobacco Use: Low Risk (11/02/2024)   Patient History    Smoking Tobacco Use: Never    Smokeless Tobacco Use: Never    Passive Exposure: Not on file  Financial Resource Strain: Not on file  Food Insecurity: No Food Insecurity (12/26/2021)   Received from [facility]   Epic    Within the past 12 months, you worried that your food would run out before you got the money to buy more.: Never true    Within the past 12 months, the food you bought just didn't last and you didn't have money to get more.: Never true  Transportation Needs: Not on file  Physical Activity: Not on file  Stress: Not on file  Social Connections: Not on file  Intimate Partner Violence: Not on file  Depression (PHQ2-9): Not on file  Alcohol  Screen: Not on file  Housing: Not on file  Utilities: Not on file  Health Literacy: Not on file       Objective:  BP 120/78   Pulse 63   Temp (!) 97.5 F (36.4 C) (Temporal)   Ht 5' 5 (1.651 m)   Wt 132 lb 12.8 oz (60.2 kg)   SpO2 98% Comment: room air  BMI 22.10 kg/m  BMI Readings from Last 3 Encounters:  11/02/24 22.10 kg/m  08/13/24 21.97 kg/m  07/06/16 21.80 kg/m    Physical Exam: Physical Exam   ENT: Normal mucosa. No hypertrophy of inferior turbinates. Tonsils are normal sized. Modified Mallampati score is normal. PULMONARY: Lungs clear to auscultation bilaterally, no adventitious breath sounds. CARDIOVASCULAR: Regular rate and rhythm, S1 S2 normal, no murmurs. ABDOMEN: Abdomen soft, nontender. Bowel sounds are normal. EXTREMITIES: No peripheral edema.       Diagnostic Review:  Last metabolic panel Lab Results  Component Value Date   GLUCOSE 112 (H) 04/11/2013   NA 143 04/11/2013   K 3.9 04/11/2013   CL 105 04/11/2013   CO2 27 04/11/2013   BUN 12 04/11/2013   CREATININE 0.80 04/11/2013   CALCIUM 9.8 04/11/2013   PROT 6.6 04/11/2013    ALBUMIN 3.8 04/11/2013   BILITOT 0.2 (L) 04/11/2013   ALKPHOS 69 04/11/2013   AST 15 04/11/2013   ALT 12 04/11/2013         Assessment & Plan:   Assessment & Plan OSA (obstructive sleep apnea)     Anxiety On trintellix.        Assessment and Plan    Sleep apnea (obstructive and central) Mild sleep apnea  with 27% central events, effectively managed with CPAP at 6 cm H2O. AHI <5. Central component limits treatment options. Inspire therapy and dental device not suitable. - Continue CPAP therapy at current settings. - Advised cleaning CPAP equipment weekly. - Advised against Inspire therapy due to central component and mild severity. - Advised against dental device due to TMJ issues. - Follow up with [facility] for new CPAP machine processing. - Schedule follow-up appointment in one year.       Notes from Dr Neysa in Oct 2025 reviewed as to gather relevant information for patient care and formulating plan.   Return in about 1 year (around 11/02/2025).   I personally spent a total of 30 minutes in the care of the patient today including preparing to see the patient, getting/reviewing separately obtained history, performing a medically appropriate exam/evaluation, counseling and educating, placing orders, documenting clinical information in the EHR, independently interpreting results, and communicating results.   Eleanna Theilen, MD      [1]  Current Outpatient Medications:    calcium carbonate (OS-CAL) 600 MG tablet, Take 600 mg by mouth 2 (two) times daily with a meal., Disp: , Rfl:    cholecalciferol (VITAMIN D) 1000 UNITS tablet, Take 5,000 Units by mouth daily., Disp: , Rfl:    fexofenadine (ALLEGRA) 60 MG tablet, Take 60 mg by mouth at bedtime., Disp: , Rfl:    lacosamide (VIMPAT) 50 MG TABS tablet, Take 50 mg by mouth 2 (two) times daily., Disp: , Rfl:    Multiple Vitamin (MULTIVITAMIN) tablet, Take 1 tablet by mouth daily., Disp: , Rfl:    mycophenolate (MYFORTIC)  360 MG TBEC EC tablet, 2 tablets twice a day, Disp: , Rfl:    omeprazole (PRILOSEC) 20 MG capsule, Take 20 mg by mouth daily., Disp: , Rfl:    rizatriptan (MAXALT) 10 MG tablet, PLEASE SEE ATTACHED FOR DETAILED DIRECTIONS, Disp: , Rfl:    vortioxetine HBr (TRINTELLIX) 20 MG TABS tablet, Take 20 mg by mouth daily., Disp: , Rfl:   "
